# Patient Record
Sex: Male | Born: 1970 | Race: White | Hispanic: No | Marital: Married | Smoking: Former smoker
Health system: Southern US, Community
[De-identification: ages and names within clinical notes are randomized; demographics above are authoritative.]

## PROBLEM LIST (undated history)

## (undated) DIAGNOSIS — E785 Hyperlipidemia, unspecified: Secondary | ICD-10-CM

## (undated) DIAGNOSIS — I10 Essential (primary) hypertension: Secondary | ICD-10-CM

## (undated) DIAGNOSIS — K219 Gastro-esophageal reflux disease without esophagitis: Secondary | ICD-10-CM

## (undated) DIAGNOSIS — N529 Male erectile dysfunction, unspecified: Secondary | ICD-10-CM

## (undated) HISTORY — DX: Gastro-esophageal reflux disease without esophagitis: K21.9

## (undated) HISTORY — DX: Male erectile dysfunction, unspecified: N52.9

## (undated) HISTORY — DX: Essential (primary) hypertension: I10

## (undated) HISTORY — DX: Hyperlipidemia, unspecified: E78.5

## (undated) HISTORY — PX: UPPER GASTROINTESTINAL ENDOSCOPY: SHX188

## (undated) HISTORY — PX: VASECTOMY: SHX75

---

## 2009-05-15 ENCOUNTER — Ambulatory Visit: Payer: Self-pay | Admitting: Family Medicine

## 2009-05-15 ENCOUNTER — Encounter: Admission: RE | Admit: 2009-05-15 | Discharge: 2009-05-15 | Payer: Self-pay | Admitting: Family Medicine

## 2009-05-15 DIAGNOSIS — E7800 Pure hypercholesterolemia, unspecified: Secondary | ICD-10-CM | POA: Insufficient documentation

## 2009-06-19 ENCOUNTER — Ambulatory Visit: Payer: Self-pay | Admitting: Family Medicine

## 2009-06-19 ENCOUNTER — Encounter: Admission: RE | Admit: 2009-06-19 | Discharge: 2009-06-19 | Payer: Self-pay | Admitting: Family Medicine

## 2009-10-26 ENCOUNTER — Ambulatory Visit: Payer: Self-pay | Admitting: Family Medicine

## 2009-10-27 ENCOUNTER — Telehealth: Payer: Self-pay | Admitting: Family Medicine

## 2009-10-28 ENCOUNTER — Encounter: Admission: RE | Admit: 2009-10-28 | Discharge: 2009-10-28 | Payer: Self-pay | Admitting: Orthopedic Surgery

## 2009-11-09 ENCOUNTER — Telehealth: Payer: Self-pay | Admitting: Family Medicine

## 2010-02-19 ENCOUNTER — Ambulatory Visit: Payer: Self-pay | Admitting: Family Medicine

## 2010-08-17 NOTE — Assessment & Plan Note (Signed)
Summary: R knee pain   Vital Signs:  Patient profile:   40 year old male Height:      72 inches Weight:      218 pounds BMI:     29.67 O2 Sat:      99 % on Room air Temp:     98.7 degrees F oral Pulse rate:   76 / minute BP sitting:   144 / 94  (left arm) Cuff size:   large  Vitals Entered By: Payton Spark CMA (October 26, 2009 11:22 AM)  O2 Flow:  Room air CC: R knee pain x 2 days and gettign worse. No known injury.  Pain Assessment Patient in pain? yes     Location: R knee Intensity: 9   Primary Care Provider:  Seymour Bars DO  CC:  R knee pain x 2 days and gettign worse. No known injury. Adrian Mccarty  History of Present Illness: 40 yo WM presents for 2 days of R knee pain that feels like a tightening around the whole knee.  He has been using the elliptical at the gym for the past 2 mos.  He slipped in the garage this weekend and the R knee progressively started hurting worse yesterday.  He has some swelling and mild bruising.   Has mostly  medial joint line pain.  No previous knee problems or surgery. Pain is improved at rest.  Worse with standing and walking.  Woke up from pain last night.  Took Ibuprofen 600 mg which did not help much.  Walking w/ a limp.  Did not use heat or ice.  No 'giving way'.      Current Medications (verified): 1)  Lipitor 20 Mg Tabs (Atorvastatin Calcium) .... Take One Tablet By Mouth Once A Day 2)  Prevacid 30 Mg Cpdr (Lansoprazole) .... Take One Tablet By Mouth Once A Day  Allergies (verified): No Known Drug Allergies  Past History:  Past Medical History: Reviewed history from 05/15/2009 and no changes required. high cholesterol  Social History: Reviewed history from 05/15/2009 and no changes required. 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 Married to [place] and has 2 sons. Never smoked.  5 ETOH/ wk. Works out 4 days./ wk. Good diet.  Review of Systems      See HPI  Physical Exam  General:  alert, well-developed,  well-nourished, and well-hydrated.   Head:  normocephalic and atraumatic.   Msk:  point tender over R medial joint line with + R knee McMurray testing over medial side --+ popping, clicking and pain.  Small effusion with faint bruising.  Neg patellar apprehension.  Normal lachmans and varus/ valgus stress w/o laxity.  passively has full R knee flexion and ext. Antalgic gait with definite limp and pain on weight bearing. Pulses:  2+ popliteal pulse R Extremities:  no ankle/ pedal edema Skin:  color normal.     Impression & Recommendations:  Problem # 1:  KNEE PAIN, RIGHT, ACUTE (ICD-719.46) Likely to be an acute medial medial meniscal tear.  Since he is young and not obese, unlikely to have DJD and given lack of direct trauma, uniliekly fractured so will bypass an Xray.  Start RX NSAID with nighttime pain meds, ice and knee sleeve for comfort.  Schedule MRI to look for meniscal tear.  AVoid exercise until given the OK> The following medications were removed from the medication list:    Vicodin 5-500 Mg Tabs (Hydrocodone-acetaminophen) .Adrian Mccarty... 1-2 tabs by mouth three times a day as  needed severe pain; take with food His updated medication list for this problem includes:    Naprosyn 500 Mg Tabs (Naproxen) .Adrian Mccarty... 1 tab by mouth two times a day with food for knee pain    Hydrocodone-acetaminophen 10-325 Mg Tabs (Hydrocodone-acetaminophen) .Adrian Mccarty... 1-2 tabs by mouth at bedtime as needed knee pain  Orders: T-MRI Lower extremity joint w/o contrast ([address])  Complete Medication List: 1)  Lipitor 20 Mg Tabs (Atorvastatin calcium) .... Take one tablet by mouth once a day 2)  Prevacid 30 Mg Cpdr (Lansoprazole) .... Take one tablet by mouth once a day 3)  Naprosyn 500 Mg Tabs (Naproxen) .Adrian Mccarty.. 1 tab by mouth two times a day with food for knee pain 4)  Hydrocodone-acetaminophen 10-325 Mg Tabs (Hydrocodone-acetaminophen) .Adrian Mccarty.. 1-2 tabs by mouth at bedtime as needed knee pain  Patient Instructions: 1)  Use R knee  sleeve. 2)  Ice R knee on and off (10 min/ time). 3)  Use Naprosyn with BF and dinner. 4)  Use Vicodin at night for pain. 5)  MRI to be scheduled. 6)  Will call you w/ information. 7)  Take it easy. Prescriptions: HYDROCODONE-ACETAMINOPHEN 10-325 MG TABS (HYDROCODONE-ACETAMINOPHEN) 1-2 tabs by mouth at bedtime as needed knee pain  #24 x 0   Entered and Authorized by:   Seymour Bars DO   Signed by:   Seymour Bars DO on 10/26/2009   Method used:   Printed then faxed to ...       [facility]* (retail)       [address] [place], [place]  [address]       Ph: [phone]       Fax: [phone]   RxID:   [id number] NAPROSYN 500 MG TABS (NAPROXEN) 1 tab by mouth two times a day with food for knee pain  #60 x 0   Entered and Authorized by:   Seymour Bars DO   Signed by:   Seymour Bars DO on 10/26/2009   Method used:   Electronically to        [facility]* (retail)       [address] [place], [place]  [address]       Ph: [phone]       Fax: [phone]   RxID:   [id number]
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
## 2010-08-17 NOTE — Progress Notes (Signed)
Summary: Vicodin refill  Phone Note Refill Request Message from:  Patient on November 09, 2009 10:09 AM  Refills Requested: Medication #1:  HYDROCODONE-ACETAMINOPHEN 10-325 MG TABS 1-2 tabs by mouth at bedtime as needed knee pain. Pt states he has a f/u w/ Dr. Lajoyce Corners in 1.5 weeks. After inj from Dr. Lajoyce Corners knee started in improve but after returning to normal activities knee feels the same as it did when it happened. Pt requests refill on vicodin until he sees Dr. Lajoyce Corners. Please advise.  Initial call taken by: Payton Spark CMA,  November 09, 2009 10:11 AM    Prescriptions: HYDROCODONE-ACETAMINOPHEN 10-325 MG TABS (HYDROCODONE-ACETAMINOPHEN) 1-2 tabs by mouth at bedtime as needed knee pain  #20 x 0   Entered and Authorized by:   Seymour Bars DO   Signed by:   Seymour Bars DO on 11/09/2009   Method used:   Printed then faxed to ...       [facility]* (retail)       [address] [place], [place]  [address]       Ph: [phone]       Fax: [phone]   RxID:   [id number]

## 2010-08-17 NOTE — Progress Notes (Signed)
Summary: R knee MRI  Phone Note Outgoing Call   Call placed by: Seymour Bars DO,  October 27, 2009 2:50 PM Summary of Call: I called and LMOM on his cell phone Re: R knee MRI...asked him to call back for details. He has a fraying lateral meniscus with a small tear of the medial mensicus along with fluid (effusion) of the knee, a strained MCL tendon, quadricepts tendinosis and irregularity under the kneecap.  Use knee sleeve, pain meds and come in for a pair of crutches to stay off the knee.  Will get him in with ortho in the next 2 wks.   Initial call taken by: Seymour Bars DO,  October 27, 2009 2:52 PM  Follow-up for Phone Call        He called back.  I called back again and left a more detailed message of MRI findings. Follow-up by: Seymour Bars DO,  October 27, 2009 3:42 PM

## 2010-09-06 ENCOUNTER — Ambulatory Visit (INDEPENDENT_AMBULATORY_CARE_PROVIDER_SITE_OTHER): Payer: 59 | Admitting: Family Medicine

## 2010-09-06 ENCOUNTER — Encounter: Payer: Self-pay | Admitting: Family Medicine

## 2010-09-06 DIAGNOSIS — R030 Elevated blood-pressure reading, without diagnosis of hypertension: Secondary | ICD-10-CM

## 2010-09-06 DIAGNOSIS — H9209 Otalgia, unspecified ear: Secondary | ICD-10-CM

## 2010-09-14 NOTE — Assessment & Plan Note (Signed)
Summary: Otalgia, BP   Vital Signs:  Patient profile:   40 year old male Height:      72 inches Weight:      216 pounds BMI:     29.40 O2 Sat:      99 % on Room air Pulse rate:   94 / minute BP sitting:   143 / 90  (left arm) Cuff size:   large  Vitals Entered By: Payton Spark CMA (September 06, 2010 1:09 PM)  O2 Flow:  Room air CC: R ear pain and HA x 3 days.   Primary Care Provider:  Seymour Bars DO  CC:  R ear pain and HA x 3 days.Marland Kitchen  History of Present Illness: R ear pain and HA x 3 days.. Sore around the cartilage. Radiating HA into the side of his head and jaw. Today right side of throat is painful. Not sure if a fever. No drainage.  Ear feels full.  No URI or cough sxs. No old surgeries. Took some nyquail.    Current Medications (verified): 1)  Lipitor 20 Mg Tabs (Atorvastatin Calcium) .... Take One Tablet By Mouth Once A Day 2)  Prevacid 30 Mg Cpdr (Lansoprazole) .... Take One Tablet By Mouth Once A Day  Allergies (verified): No Known Drug Allergies  Physical Exam  General:  Well-developed,well-nourished,in no acute distress; alert,appropriate and cooperative throughout examination Head:  Normocephalic and atraumatic without obvious abnormalities. No apparent alopecia or balding. Eyes:  No corneal or conjunctival inflammation noted. EOMI. Perrla.  Ears:  External ear exam shows no significant lesions or deformities.  Otoscopic examination reveals clear canals, tympanic membranes are intact bilaterally without bulging, retraction, inflammation or discharge. Hearing is grossly normal bilaterally.Small amt of fluid in behind the right TM.  Nose:  External nasal examination shows no deformity or inflammation.  Mouth:  Oral mucosa and oropharynx without lesions or exudates.  Teeth in good repair. Neck:  No deformities, masses, or tenderness noted. Lungs:  Normal respiratory effort, chest expands symmetrically. Lungs are clear to auscultation, no crackles or  wheezes. Heart:  Normal rate and regular rhythm. S1 and S2 normal without gallop, murmur, click, rub or other extra sounds. Skin:  no rashes.   Cervical Nodes:  No lymphadenopathy noted Psych:  Cognition and judgment appear intact. Alert and cooperative with normal attention span and concentration. No apparent delusions, illusions, hallucinations   Impression & Recommendations:  Problem # 1:  OTALGIA (ICD-388.70) No sign of acute infection. Likey eustachian tube dysfuction.  Dicussed trial of nasal steroid spray.  Avoid decongestants since BP elevated today.  Also can try an OTC antihistamine.    Problem # 2:  ELEVATED BLOOD PRESSURE (ICD-796.2) Discussed need to f/u for recheck in 2 weeks when not in pain and off all cold medications.  He did have a card from giving blood where some of his BPs were high but some were normal He says he already avoids salt in his diet.   Complete Medication List: 1)  Lipitor 20 Mg Tabs (Atorvastatin calcium) .... Take one tablet by mouth once a day 2)  Prevacid 30 Mg Cpdr (Lansoprazole) .... Take one tablet by mouth once a day 3)  Fluticasone Propionate 50 Mcg/act Susp (Fluticasone propionate) .... 2 sprays in each nostril daily  Patient Instructions: 1)  Follow up with Dr. Cathey Endow in 2 weeks for Blood pressure check 2)  Can take allegra, claritin, or zyrtec OTC and use the nasal spray (2 sprays in each nostril)  to  see if helps your ear symptoms.  3)  If you suddenly get worse then call our office.  Prescriptions: FLUTICASONE PROPIONATE 50 MCG/ACT SUSP (FLUTICASONE PROPIONATE) 2 sprays in each nostril daily  #1 x 0   Entered and Authorized by:   Nani Gasser MD   Signed by:   Nani Gasser MD on 09/06/2010   Method used:   Electronically to        [facility] [phone]* (retail)       [address]       [place], [place]  [address]       Ph: [phone] or [phone]       Fax: [phone]   RxID:   [id number]    Orders  Added: 1)  Est. Patient Level III [[address]]

## 2011-06-28 ENCOUNTER — Ambulatory Visit (INDEPENDENT_AMBULATORY_CARE_PROVIDER_SITE_OTHER): Payer: PRIVATE HEALTH INSURANCE | Admitting: Family Medicine

## 2011-06-28 ENCOUNTER — Encounter: Payer: Self-pay | Admitting: Family Medicine

## 2011-06-28 DIAGNOSIS — I10 Essential (primary) hypertension: Secondary | ICD-10-CM

## 2011-06-28 DIAGNOSIS — E785 Hyperlipidemia, unspecified: Secondary | ICD-10-CM

## 2011-06-28 DIAGNOSIS — K219 Gastro-esophageal reflux disease without esophagitis: Secondary | ICD-10-CM

## 2011-06-28 DIAGNOSIS — Z23 Encounter for immunization: Secondary | ICD-10-CM

## 2011-06-28 DIAGNOSIS — N529 Male erectile dysfunction, unspecified: Secondary | ICD-10-CM

## 2011-06-28 MED ORDER — TADALAFIL 5 MG PO TABS: 5.0000 mg | ORAL_TABLET | Freq: Every day | ORAL | Status: DC | PRN

## 2011-06-28 MED ORDER — ATORVASTATIN CALCIUM 20 MG PO TABS: 20.0000 mg | ORAL_TABLET | Freq: Every day | ORAL | Status: DC

## 2011-06-28 MED ORDER — IRBESARTAN 150 MG PO TABS: 150.0000 mg | ORAL_TABLET | Freq: Every day | ORAL | Status: DC

## 2011-06-28 MED ORDER — TETANUS-DIPHTH-ACELL PERTUSSIS 5-2.5-18.5 LF-MCG/0.5 IM SUSP: 0.5000 mL | Freq: Once | INTRAMUSCULAR | Status: DC

## 2011-06-28 NOTE — Patient Instructions (Signed)
Erectile Dysfunction Erectile dysfunction (ED) is the inability to get a good enough erection to have sexual intercourse. ED may involve:  Inability to get an erection.   Lack of enough hardness to allow penetration.   Loss of the erection before sex is finished.   Premature ejaculation.   Any combination of these problems if they occur more than 25% of the time.  CAUSES  Certain drugs, such as:   Pain relievers.   Antihistamines.   Antidepressants.   Blood pressure medicines.   Water pills.   Ulcer medicines.   Muscle relaxants.   Illegal drugs.   Excessive drinking.   Psychological causes, such as:   Anxiety.   Depression.   Sadness.   Exhaustion.   Performance fear.   Stress.   Physical causes, such as:   Artery problems. This may include diabetes, smoking, liver disease, or atherosclerosis.   High blood pressure.   Hormonal problems, such as low testosterone.   Obesity.   Nerve problems. This may include back or pelvic injuries, diabetes, multiple sclerosis, Parkinson's disease, or some surgeries.  SYMPTOMS  Inability to get an erection.   Lack of enough hardness to allow penetration.   Loss of the erection before sex is finished.   Premature ejaculation.   Normal erections at some times, but with frequent unsatisfactory episodes.   Orgasms that are not satisfactory in sensation or frequency.   Low sexual satisfaction in either partner because of erection problems.   A curved penis occurring with erection. The curve may cause pain or may be too curved to allow for intercourse.   Never having nighttime erections.  DIAGNOSIS Your caregiver can often diagnose this condition by:  Performing a physical exam to find other diseases or specific problems with the penis.   Asking you detailed questions about the problem.   Performing blood tests to check for diabetes or to measure hormone levels.   Performing urine tests to find other  underlying health conditions.   Performing an ultrasound to check for scarring.   Performing a test to check blood flow to the penis.   Doing a sleep study at home to measure nighttime erections.  TREATMENT   You may be prescribed medicines by mouth.   You may be given medicine injections into the penis.   You may be prescribed a vacuum pump with a ring.   Penile implant surgery may be performed. You may receive:   An inflatable implant.   A semi-rigid implant.   Blood vessel surgery may be performed.  HOME CARE INSTRUCTIONS  Take all medicine as directed by your caregiver. Do not take any other medicines without talking to your caregiver first.   Follow your caregiver's directions for specific treatments as prescribed.   Follow up with your caregiver as directed.  Document Released: 07/01/2000 Document Revised: 03/16/2011 Document Reviewed: 10/24/2010 [facility] Patient Information 2012 [place], [place].Hypertriglyceridemia  Diet for High blood levels of Triglycerides Most fats in food are triglycerides. Triglycerides in your blood are stored as fat in your body. High levels of triglycerides in your blood may put you at a greater risk for heart disease and stroke.  Normal triglyceride levels are less than 150 mg/dL. Borderline high levels are 150-199 mg/dl. High levels are 200 - 499 mg/dL, and very high triglyceride levels are greater than 500 mg/dL. The decision to treat high triglycerides is generally based on the level. For people with borderline or high triglyceride levels, treatment includes weight loss and exercise.  Drugs are recommended for people with very high triglyceride levels. Many people who need treatment for high triglyceride levels have metabolic syndrome. This syndrome is a collection of disorders that often include: insulin resistance, high blood pressure, blood clotting problems, high cholesterol and triglycerides. TESTING PROCEDURE FOR TRIGLYCERIDES  You should  not eat 4 hours before getting your triglycerides measured. The normal range of triglycerides is between 10 and 250 milligrams per deciliter (mg/dl). Some people may have extreme levels (1000 or above), but your triglyceride level may be too high if it is above 150 mg/dl, depending on what other risk factors you have for heart disease.   People with high blood triglycerides may also have high blood cholesterol levels. If you have high blood cholesterol as well as high blood triglycerides, your risk for heart disease is probably greater than if you only had high triglycerides. High blood cholesterol is one of the main risk factors for heart disease.  CHANGING YOUR DIET  Your weight can affect your blood triglyceride level. If you are more than 20% above your ideal body weight, you may be able to lower your blood triglycerides by losing weight. Eating less and exercising regularly is the best way to combat this. Fat provides more calories than any other food. The best way to lose weight is to eat less fat. Only 30% of your total calories should come from fat. Less than 7% of your diet should come from saturated fat. A diet low in fat and saturated fat is the same as a diet to decrease blood cholesterol. By eating a diet lower in fat, you may lose weight, lower your blood cholesterol, and lower your blood triglyceride level.  Eating a diet low in fat, especially saturated fat, may also help you lower your blood triglyceride level. Ask your dietitian to help you figure how much fat you can eat based on the number of calories your caregiver has prescribed for you.  Exercise, in addition to helping with weight loss may also help lower triglyceride levels.   Alcohol can increase blood triglycerides. You may need to stop drinking alcoholic beverages.   Too much carbohydrate in your diet may also increase your blood triglycerides. Some complex carbohydrates are necessary in your diet. These may include bread, rice,  potatoes, other starchy vegetables and cereals.   Reduce "simple" carbohydrates. These may include pure sugars, candy, honey, and jelly without losing other nutrients. If you have the kind of high blood triglycerides that is affected by the amount of carbohydrates in your diet, you will need to eat less sugar and less high-sugar foods. Your caregiver can help you with this.   Adding 2-4 grams of fish oil (EPA+ DHA) may also help lower triglycerides. Speak with your caregiver before adding any supplements to your regimen.  Following the Diet  Maintain your ideal weight. Your caregivers can help you with a diet. Generally, eating less food and getting more exercise will help you lose weight. Joining a weight control group may also help. Ask your caregivers for a good weight control group in your area.  Eat low-fat foods instead of high-fat foods. This can help you lose weight too.  These foods are lower in fat. Eat MORE of these:   Dried beans, peas, and lentils.   Egg whites.   Low-fat cottage cheese.   Fish.   Lean cuts of meat, such as round, sirloin, rump, and flank (cut extra fat off meat you fix).   Whole grain breads,  cereals and pasta.   Skim and nonfat dry milk.   Low-fat yogurt.   Poultry without the skin.   Cheese made with skim or part-skim milk, such as mozzarella, parmesan, farmers', ricotta, or pot cheese.  These are higher fat foods. Eat LESS of these:   Whole milk and foods made from whole milk, such as American, blue, cheddar, monterey jack, and swiss cheese   High-fat meats, such as luncheon meats, sausages, knockwurst, bratwurst, hot dogs, ribs, corned beef, ground pork, and regular ground beef.   Fried foods.  Limit saturated fats in your diet. Substituting unsaturated fat for saturated fat may decrease your blood triglyceride level. You will need to read package labels to know which products contain saturated fats.  These foods are high in saturated fat. Eat  LESS of these:   Fried pork skins.   Whole milk.   Skin and fat from poultry.   Palm oil.   Butter.   Shortening.   Cream cheese.   Tomasa Blase.   Margarines and baked goods made from listed oils.   Vegetable shortenings.   Chitterlings.   Fat from meats.   Coconut oil.   Palm kernel oil.   Lard.   Cream.   Sour cream.   Fatback.   Coffee whiteners and non-dairy creamers made with these oils.   Cheese made from whole milk.  Use unsaturated fats (both polyunsaturated and monounsaturated) moderately. Remember, even though unsaturated fats are better than saturated fats; you still want a diet low in total fat.  These foods are high in unsaturated fat:   Canola oil.   Sunflower oil.   Mayonnaise.   Almonds.   Peanuts.   Pine nuts.   Margarines made with these oils.   Safflower oil.   Olive oil.   Avocados.   Cashews.   Peanut butter.   Sunflower seeds.   Soybean oil.   Peanut oil.   Olives.   Pecans.   Walnuts.   Pumpkin seeds.  Avoid sugar and other high-sugar foods. This will decrease carbohydrates without decreasing other nutrients. Sugar in your food goes rapidly to your blood. When there is excess sugar in your blood, your liver may use it to make more triglycerides. Sugar also contains calories without other important nutrients.  Eat LESS of these:   Sugar, brown sugar, powdered sugar, jam, jelly, preserves, honey, syrup, molasses, pies, candy, cakes, cookies, frosting, pastries, colas, soft drinks, punches, fruit drinks, and regular gelatin.   Avoid alcohol. Alcohol, even more than sugar, may increase blood triglycerides. In addition, alcohol is high in calories and low in nutrients. Ask for sparkling water, or a diet soft drink instead of an alcoholic beverage.  Suggestions for planning and preparing meals   Bake, broil, grill or roast meats instead of frying.   Remove fat from meats and skin from poultry before cooking.   Add  spices, herbs, lemon juice or vinegar to vegetables instead of salt, rich sauces or gravies.   Use a non-stick skillet without fat or use no-stick sprays.   Cool and refrigerate stews and broth. Then remove the hardened fat floating on the surface before serving.   Refrigerate meat drippings and skim off fat to make low-fat gravies.   Serve more fish.   Use less butter, margarine and other high-fat spreads on bread or vegetables.   Use skim or reconstituted non-fat dry milk for cooking.   Cook with low-fat cheeses.   Substitute low-fat yogurt or cottage cheese for  all or part of the sour cream in recipes for sauces, dips or congealed salads.   Use half yogurt/half mayonnaise in salad recipes.   Substitute evaporated skim milk for cream. Evaporated skim milk or reconstituted non-fat dry milk can be whipped and substituted for whipped cream in certain recipes.   Choose fresh fruits for dessert instead of high-fat foods such as pies or cakes. Fruits are naturally low in fat.  When Dining Out   Order low-fat appetizers such as fruit or vegetable juice, pasta with vegetables or tomato sauce.   Select clear, rather than cream soups.   Ask that dressings and gravies be served on the side. Then use less of them.   Order foods that are baked, broiled, poached, steamed, stir-fried, or roasted.   Ask for margarine instead of butter, and use only a small amount.   Drink sparkling water, unsweetened tea or coffee, or diet soft drinks instead of alcohol or other sweet beverages.  QUESTIONS AND ANSWERS ABOUT OTHER FATS IN THE BLOOD: SATURATED FAT, TRANS FAT, AND CHOLESTEROL What is trans fat? Trans fat is a type of fat that is formed when vegetable oil is hardened through a process called hydrogenation. This process helps makes foods more solid, gives them shape, and prolongs their shelf life. Trans fats are also called hydrogenated or partially hydrogenated oils.  What do saturated fat,  trans fat, and cholesterol in foods have to do with heart disease? Saturated fat, trans fat, and cholesterol in the diet all raise the level of LDL "bad" cholesterol in the blood. The higher the LDL cholesterol, the greater the risk for coronary heart disease (CHD). Saturated fat and trans fat raise LDL similarly.  What foods contain saturated fat, trans fat, and cholesterol? High amounts of saturated fat are found in animal products, such as fatty cuts of meat, chicken skin, and full-fat dairy products like butter, whole milk, cream, and cheese, and in tropical vegetable oils such as palm, palm kernel, and coconut oil. Trans fat is found in some of the same foods as saturated fat, such as vegetable shortening, some margarines (especially hard or stick margarine), crackers, cookies, baked goods, fried foods, salad dressings, and other processed foods made with partially hydrogenated vegetable oils. Small amounts of trans fat also occur naturally in some animal products, such as milk products, beef, and lamb. Foods high in cholesterol include liver, other organ meats, egg yolks, shrimp, and full-fat dairy products. How can I use the new food label to make heart-healthy food choices? Check the Nutrition Facts panel of the food label. Choose foods lower in saturated fat, trans fat, and cholesterol. For saturated fat and cholesterol, you can also use the Percent Daily Value (%DV): 5% DV or less is low, and 20% DV or more is high. (There is no %DV for trans fat.) Use the Nutrition Facts panel to choose foods low in saturated fat and cholesterol, and if the trans fat is not listed, read the ingredients and limit products that list shortening or hydrogenated or partially hydrogenated vegetable oil, which tend to be high in trans fat. POINTS TO REMEMBER: YOU NEED A LITTLE TLC (THERAPEUTIC LIFESTYLE CHANGES)  Discuss your risk for heart disease with your caregivers, and take steps to reduce risk factors.   Change  your diet. Choose foods that are low in saturated fat, trans fat, and cholesterol.   Add exercise to your daily routine if it is not already being done. Participate in physical activity of moderate intensity,  like brisk walking, for at least 30 minutes on most, and preferably all days of the week. No time? Break the 30 minutes into three, 10-minute segments during the day.   Stop smoking. If you do smoke, contact your caregiver to discuss ways in which they can help you quit.   Do not use street drugs.   Maintain a normal weight.   Maintain a healthy blood pressure.   Keep up with your blood work for checking the fats in your blood as directed by your caregiver.  Document Released: 04/21/2004 Document Revised: 03/16/2011 Document Reviewed: 11/17/2008 [facility] Patient Information 2012 [place], [place].

## 2011-06-28 NOTE — Progress Notes (Signed)
  Subjective:    Patient ID: [place] Cinqu[place], male    DOB: 07-10-1971, 40 y.o.   MRN: [id number]  Hyperlipidemia This is a chronic problem. The current episode started more than 1 year ago. The problem is uncontrolled. Recent lipid tests were reviewed and are high. Factors aggravating his hyperlipidemia include no known factors. Associated symptoms include myalgias. Pertinent negatives include no chest pain, focal sensory loss, focal weakness, leg pain or shortness of breath. Current antihyperlipidemic treatment includes statins. The current treatment provides moderate improvement of lipids. Compliance problems include adherence to exercise.  Risk factors for coronary artery disease include hypertension, male sex and family history.  Hypertension This is a chronic problem. The current episode started more than 1 year ago. The problem is uncontrolled. Pertinent negatives include no chest pain or shortness of breath. There are no associated agents to hypertension. Risk factors for coronary artery disease include male gender, dyslipidemia and family history. Compliance problems include medication cost.  There is no history of angina, kidney disease, CAD/MI, CVA or left ventricular hypertrophy.   he states that his blood pressure always stays a low but higher than what it should be and that he is on Cozaar right now but doesn't seem to get his blood pressure under good control He also reports having blood work drawn at work and it also showed that his HDL was in the 60s LDL was about [birth year]1928 and his total cholesterol was higher than it was before when he was on Lipitor. He is currently on Zocor. #3 erectile dysfunction reports as being on Cialis erectile dysfunction has improved greatly he also reports that the recurrence of prostatitis and epididymitis that he was having seems to also cleared up. #4 Immunizations up-to-date needed  Review of Systems  Respiratory: Negative for shortness of breath.     Cardiovascular: Negative for chest pain.  Genitourinary:       Erectile dysfunction  Musculoskeletal: Positive for myalgias.  Neurological: Negative for focal weakness.       Objective:   Physical Exam BP 143/96  Pulse 77  Ht 6' (1.829 m)  Wt 221 lb (100.245 kg)  BMI 29.97 kg/m2  SpO2 100%  Well-nourished well-developed white male no acute distress lungs were clear to culture to auscultation percussion cardiac S1-S2 regular rate and rhythm Neck supple thyroid gland within normal limits     Assessment & Plan:  #1 hypertension discussed them my lack of confidence and Cozaar we'll place him on Avapro 150 one tablet a day and titrate him up as needed to 300. We'll even go as far as needed place him on Avalide. 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 Addendum he's informed me that he takes Prevacid 15 mg over the OTC he doesn't get a prescription for that.

## 2011-07-08 ENCOUNTER — Other Ambulatory Visit: Payer: Self-pay | Admitting: *Deleted

## 2011-07-08 DIAGNOSIS — N529 Male erectile dysfunction, unspecified: Secondary | ICD-10-CM

## 2011-07-08 MED ORDER — TADALAFIL 5 MG PO TABS: 5.0000 mg | ORAL_TABLET | Freq: Every day | ORAL | Status: DC | PRN

## 2011-09-06 ENCOUNTER — Other Ambulatory Visit: Payer: Self-pay | Admitting: *Deleted

## 2011-09-06 DIAGNOSIS — E785 Hyperlipidemia, unspecified: Secondary | ICD-10-CM

## 2011-09-06 DIAGNOSIS — I10 Essential (primary) hypertension: Secondary | ICD-10-CM

## 2011-09-06 MED ORDER — ATORVASTATIN CALCIUM 20 MG PO TABS: 20.0000 mg | ORAL_TABLET | Freq: Every day | ORAL | Status: DC

## 2011-09-06 MED ORDER — IRBESARTAN 150 MG PO TABS: 150.0000 mg | ORAL_TABLET | Freq: Every day | ORAL | Status: DC

## 2011-09-15 ENCOUNTER — Ambulatory Visit: Payer: PRIVATE HEALTH INSURANCE | Admitting: Family Medicine

## 2011-09-16 ENCOUNTER — Ambulatory Visit (INDEPENDENT_AMBULATORY_CARE_PROVIDER_SITE_OTHER): Payer: PRIVATE HEALTH INSURANCE | Admitting: Physician Assistant

## 2011-09-16 ENCOUNTER — Encounter: Payer: Self-pay | Admitting: Physician Assistant

## 2011-09-16 VITALS — BP 132/84 | HR 101 | Temp 98.7°F | Ht 72.0 in | Wt 224.0 lb

## 2011-09-16 DIAGNOSIS — R05 Cough: Secondary | ICD-10-CM

## 2011-09-16 DIAGNOSIS — R059 Cough, unspecified: Secondary | ICD-10-CM

## 2011-09-16 DIAGNOSIS — J329 Chronic sinusitis, unspecified: Secondary | ICD-10-CM

## 2011-09-16 MED ORDER — AMOXICILLIN-POT CLAVULANATE 875-125 MG PO TABS: 1.0000 | ORAL_TABLET | Freq: Two times a day (BID) | ORAL | Status: AC

## 2011-09-16 MED ORDER — HYDROCOD POLST-CHLORPHEN POLST 10-8 MG/5ML PO LQCR: 5.0000 mL | Freq: Every evening | ORAL | Status: DC | PRN

## 2011-09-16 NOTE — Patient Instructions (Signed)
Start Augmentin for 10 days twice a day. Tussinonex was given for cough at night. Follow up if not improving in 3 days.

## 2011-09-16 NOTE — Progress Notes (Signed)
  Subjective:    Patient ID: Adrian Mccarty, male    DOB: [birth year], 41 y.o.   MRN: [id number]  HPI Patient has had upper respiratory symptoms for 5 days. He has had sinus pressure, teeth hurting, cough that is worse at night, runny nose, sore throat, and headache. He denies any fever, N/V, or ear pain. Mucinex D has helped somewhat but still continues to have symptoms.     Review of Systems     Objective:   Physical Exam  Constitutional: He appears well-developed and well-nourished.  HENT:  Head: Normocephalic and atraumatic.  Right Ear: External ear normal.  Left Ear: External ear normal.  Nose: Nose normal.  Mouth/Throat: Oropharynx is clear and moist. No oropharyngeal exudate.       TM's are normal bilaterally. Maxillary tenderness noted bilaterally with palpation.   Eyes: Conjunctivae are normal.  Neck:       Bilateral anterior cervical adenopathy with tenderness to palpation.  Lymphadenopathy:    He has cervical adenopathy.          Assessment & Plan:  Sinusitis- Augmentin twice a day for 10 days. Continue with other symptomatic treatment. Follow up if not improving in 3 days.  Cough- Tussionex for cough to use at bedtime only. Continue to cough during the day to get up any mucus that might settle.

## 2011-10-30 ENCOUNTER — Other Ambulatory Visit: Payer: Self-pay | Admitting: Family Medicine

## 2011-11-02 ENCOUNTER — Other Ambulatory Visit: Payer: Self-pay | Admitting: *Deleted

## 2011-11-02 DIAGNOSIS — N529 Male erectile dysfunction, unspecified: Secondary | ICD-10-CM

## 2011-11-02 MED ORDER — TADALAFIL 5 MG PO TABS: 5.0000 mg | ORAL_TABLET | Freq: Every day | ORAL | Status: DC | PRN

## 2011-11-15 ENCOUNTER — Ambulatory Visit (INDEPENDENT_AMBULATORY_CARE_PROVIDER_SITE_OTHER): Payer: PRIVATE HEALTH INSURANCE | Admitting: Family Medicine

## 2011-11-15 ENCOUNTER — Encounter: Payer: Self-pay | Admitting: Family Medicine

## 2011-11-15 VITALS — BP 138/90 | HR 89 | Ht 72.0 in | Wt 223.0 lb

## 2011-11-15 DIAGNOSIS — Z1211 Encounter for screening for malignant neoplasm of colon: Secondary | ICD-10-CM

## 2011-11-15 DIAGNOSIS — B356 Tinea cruris: Secondary | ICD-10-CM

## 2011-11-15 DIAGNOSIS — Z0000 Encounter for general adult medical examination without abnormal findings: Secondary | ICD-10-CM

## 2011-11-15 DIAGNOSIS — I10 Essential (primary) hypertension: Secondary | ICD-10-CM | POA: Insufficient documentation

## 2011-11-15 DIAGNOSIS — M7552 Bursitis of left shoulder: Secondary | ICD-10-CM

## 2011-11-15 DIAGNOSIS — N529 Male erectile dysfunction, unspecified: Secondary | ICD-10-CM | POA: Insufficient documentation

## 2011-11-15 DIAGNOSIS — E785 Hyperlipidemia, unspecified: Secondary | ICD-10-CM

## 2011-11-15 LAB — POCT URINALYSIS DIPSTICK
Blood, UA: NEGATIVE
Ketones, UA: NEGATIVE
Nitrite, UA: NEGATIVE

## 2011-11-15 LAB — CBC WITH DIFFERENTIAL/PLATELET
Basophils Absolute: 0 10*3/uL (ref 0.0–0.1)
Eosinophils Relative: 1 % (ref 0–5)
HCT: 42 % (ref 39.0–52.0)
Lymphs Abs: 1.5 10*3/uL (ref 0.7–4.0)
Monocytes Absolute: 0.5 10*3/uL (ref 0.1–1.0)
Monocytes Relative: 9 % (ref 3–12)
Neutrophils Relative %: 64 % (ref 43–77)
RDW: 13.4 % (ref 11.5–15.5)
WBC: 5.9 10*3/uL (ref 4.0–10.5)

## 2011-11-15 LAB — COMPREHENSIVE METABOLIC PANEL
ALT: 17 U/L (ref 0–53)
Albumin: 5.1 g/dL (ref 3.5–5.2)
Chloride: 100 mEq/L (ref 96–112)
Creat: 0.91 mg/dL (ref 0.50–1.35)
Glucose, Bld: 105 mg/dL — ABNORMAL HIGH (ref 70–99)

## 2011-11-15 LAB — PSA, TOTAL AND FREE
PSA, Free Pct: 21 % — ABNORMAL LOW (ref >25)
PSA: 1.12 ng/mL (ref <=4.00)

## 2011-11-15 LAB — LIPID PANEL
LDL Cholesterol: 112 mg/dL — ABNORMAL HIGH (ref 0–99)
Triglycerides: 140 mg/dL (ref <150)

## 2011-11-15 LAB — TSH: TSH: 2.649 u[IU]/mL (ref 0.350–4.500)

## 2011-11-15 MED ORDER — MELOXICAM 15 MG PO TABS: 15.0000 mg | ORAL_TABLET | Freq: Every day | ORAL | Status: DC | PRN

## 2011-11-15 MED ORDER — KETOCONAZOLE 2 % EX CREA: TOPICAL_CREAM | Freq: Two times a day (BID) | CUTANEOUS | Status: DC

## 2011-11-15 MED ORDER — TADALAFIL 20 MG PO TABS
20.0000 mg | ORAL_TABLET | Freq: Every day | ORAL | Status: DC | PRN
Start: 2011-11-15 — End: 2012-05-03

## 2011-11-15 NOTE — Patient Instructions (Signed)
Radial Nerve Palsy Wrist drop is also known as radial nerve palsy. It is a condition in which you can not extend your wrist. This means if you are standing with your elbow bent at a right angle and with the top of your hand pointed at the ceiling, you can not hold your hand up. It falls toward the floor.  This action of extending your wrist is caused by the muscles in the back of your arm. These muscles are controlled by the radial nerve. This means that anything affecting the radial nerve so it can not tell the muscles how to work will cause wrist drop. This is medically called radial nerve palsy. Also the radial nerve is a motor and sensory nerve so anything affecting it causes problems with movement and feeling. CAUSES  Some more common causes of wrist drop are:  A break (fracture) of the large bone in the arm between your shoulder and your elbow (humerus). This is because the radial nerve winds around the humerus.   Improper use of crutches causes this because the radial nerve runs through the armpit (axilla). Crutches which are too long can put pressure on the nerve. This is sometimes called crutch palsy.   Falling asleep with your arm over a chair and supported on the back is a common cause. This is sometimes called Saturday Night Syndrome.   Wrist drop can be associated with lead poisoning because of the effect of lead on the radial nerve.  SYMPTOMS  The wrist drop is an obvious problem, but there may also be numbness in the back of the arm, forearm or hand which provides feeling in these areas by the radial nerve. There can be difficulty straightening out the elbow in addition to the wrist. There may be numbness, tingling, pain, burning sensations or other abnormal feelings. Symptoms depend entirely on where the radial nerve is injured. DIAGNOSIS   Wrist drop is obvious just by looking at it. Your caregiver may make the diagnosis by taking your history and doing a couple tests.   One test  which may be done is a nerve conduction study. This test shows if the radial nerve is conducting signals well. If not, it can determine where the nerve problem is.   Sometimes X-ray studies are done. Your caregiver will determine if further testing needs to be done.  TREATMENT   Usually if the problem is found to be pressure on the nerve, simply removing the pressure will allow the nerve to go back to normal in a few weeks to a few months. Other treatments will depend upon the cause found.   Only take over-the-counter or prescription medicines for pain, discomfort, or fever as directed by your caregiver.   Sometimes seizure medications are used.   Steroids are sometimes given to decrease swelling if it is thought to be a possible cause.  Document Released: 03/10/2006 Document Revised: 06/23/2011 Document Reviewed: 04/20/2006 [facility] Patient Information 2012 [place], [place].Bursitis Bursitis is a swelling and soreness (inflammation) of a fluid-filled sac (bursa) that overlies and protects a joint. It can be caused by injury, overuse of the joint, arthritis or infection. The joints most likely to be affected are the elbows, shoulders, hips and knees. HOME CARE INSTRUCTIONS   Apply ice to the affected area for 15 to 20 minutes each hour while awake for 2 days. Put the ice in a plastic bag and place a towel between the bag of ice and your skin.   Rest the injured  joint as much as possible, but continue to put the joint through a full range of motion, 4 times per day. (The shoulder joint especially becomes rapidly "frozen" if not used.) When the pain lessens, begin normal slow movements and usual activities.   Only take over-the-counter or prescription medicines for pain, discomfort or fever as directed by your caregiver.   Your caregiver may recommend draining the bursa and injecting medicine into the bursa. This may help the healing process.   Follow all instructions for follow-up with your  caregiver. This includes any orthopedic referrals, physical therapy and rehabilitation. Any delay in obtaining necessary care could result in a delay or failure of the bursitis to heal and chronic pain.  SEEK IMMEDIATE MEDICAL CARE IF:   Your pain increases even during treatment.   You develop an oral temperature above 102 F (38.9 C) and have heat and inflammation over the involved bursa.  MAKE SURE YOU:   Understand these instructions.   Will watch your condition.   Will get help right away if you are not doing well or get worse.  Document Released: 07/01/2000 Document Revised: 06/23/2011 Document Reviewed: 06/05/2009 [facility] Patient Information 2012 [place], [place].

## 2011-11-15 NOTE — Progress Notes (Signed)
Subjective:    Patient ID: Adrian Mccarty, male    DOB: [birth year], 41 y.o.   MRN: [id number]  HPI Patient's here for physical examination. Reports he is happy with his BP results. He states he checks his blood pressure regular basis and it looks great. He will surprise it was as high as it was today.   Review of Systems  Constitutional: Positive for activity change.       Reports starting to exercise more  Musculoskeletal: Positive for myalgias and arthralgias.       He reports pain in the left shoulder.  He also reports pain in both hands when he does repetitive motions or movements. He states worse in the right than the left and basically involves the thumb pointed finger and index finger. He states the hand feels like it is falling asleep and he has to shake it to wake it up.  Skin: Positive for rash.       Reports rash between his legs but seemed to get worse after exercising or doing activities that makes him sweat a lot   No Known Allergies History   Social History  . Marital Status: Married    Spouse Name: N/A    Number of Children: N/A  . Years of Education: N/A   Occupational History  . Not on file.   Social History Main Topics  . Smoking status: Former Games developer  . Smokeless tobacco: Current User    Types: Chew  . Alcohol Use: 3.6 oz/week    6 Cans of beer per week  . Drug Use: Not on file  . Sexually Active: Yes    Birth Control/ Protection: Surgical   Other Topics Concern  . Not on file   Social History Narrative  . No narrative on file   Family History  Problem Relation Age of Onset  . Heart disease Mother   . Hypertension Father   . Heart disease Maternal Grandfather   . Heart disease Paternal Grandfather    Past Medical History  Diagnosis Date  . Hypertension   . Hyperlipidemia   . Erectile dysfunction   . GERD (gastroesophageal reflux disease)    No past surgical history on file.     BP 138/90  Pulse 89  Ht 6' (1.829 m)  Wt 223 lb (101.152  kg)  BMI 30.24 kg/m2  SpO2 100% Objective:   Physical Exam  Constitutional: He is oriented to person, place, and time. He appears well-developed and well-nourished. No distress.  HENT:  Head: Normocephalic.  Eyes: Conjunctivae are normal. Pupils are equal, round, and reactive to light.  Fundoscopic exam:      The right eye shows no arteriolar narrowing and no AV nicking.       The left eye shows no arteriolar narrowing and no AV nicking.  Neck: Normal range of motion. Neck supple. No tracheal deviation present. No thyromegaly present.  Cardiovascular: Normal rate, regular rhythm and normal heart sounds.   No murmur heard. Pulmonary/Chest: Effort normal and breath sounds normal. No respiratory distress. He has no wheezes.  Abdominal: Soft. Bowel sounds are normal. He exhibits no distension. There is no tenderness. Hernia confirmed negative in the right inguinal area and confirmed negative in the left inguinal area.  Genitourinary: Rectum normal, prostate normal, testes normal and penis normal. Rectal exam shows no external hemorrhoid, no fissure and anal tone normal. Guaiac negative stool. Prostate is not enlarged and not tender. Right testis shows no mass and no tenderness. Left  testis shows no mass and no tenderness. Circumcised. No penile tenderness. No discharge found.  Musculoskeletal: Normal range of motion. He exhibits tenderness.       Tenderness over the left deltoid pinpoint location consistent with bursitis of the left shoulder  Lymphadenopathy:    He has no cervical adenopathy.  Neurological: He is alert and oriented to person, place, and time. He has normal reflexes. No cranial nerve deficit.       Tenderness over the right radial area of the wrist which reproduces the pain he experiences and thumb and pointer and index finger with palpation.  Skin: Skin is warm and dry. Rash noted. No erythema.       Rash in the groin area consistent with tinea infection.  Psychiatric: He has a  normal mood and affect.   EKG showed a sinus rhythm.       Results for orders placed in visit on 11/15/11  POCT URINALYSIS DIPSTICK      Component Value Range   Color, UA yellow     Clarity, UA clear     Glucose, UA neg     Bilirubin, UA neg     Ketones, UA neg     Spec Grav, UA 1.010     Blood, UA neg     pH, UA 7.0     Protein, UA neg     Urobilinogen, UA 0.2     Nitrite, UA neg     Leukocytes, UA Negative     Assessment & Plan:  #1 health maintenance. Routine labs will be obtained including CBC CMP, TSH, lipid, testosterone and PSA., #2 hypertension. I considered increasing Avapro but since it has been fine when he's checked at home and there is some increased erectile dysfunction would not make any changes at this time. #3 hyperlipidemia. Maintain Lipitor 20 mg by mouth. #4 erectile dysfunction. We'll give him the option to use Cialis 20 mg one half to one tablet as needed versus daily Cialis. Samples of the 20 given for him to try. Explained his insurance is going to pay for one. Testosterone level and PSA will be obtained. As well as a TSH. #5 GERD. Prevacid is working well. #6 tinea cruris. Nizoral cream twice a day for least 2 weeks warned the rash may come back.  #7 shoulder bursitis. Mobic 15 mg one tablet a day for 2 weeks and then use when necessary. If not better or improved return for possible bursa injection. #8 radial nerve impingement or entrapment. Mobic may help with that if not and if it gets worse may need nerve conduction study and possible orthopedic referral. Return in 6 months for followup.

## 2011-11-16 LAB — TESTOSTERONE, FREE, TOTAL, SHBG
Sex Hormone Binding: 20 nmol/L (ref 13–71)
Testosterone, Free: 82.3 pg/mL (ref 47.0–244.0)
Testosterone-% Free: 2.6 % (ref 1.6–2.9)
Testosterone: 321.3 ng/dL (ref 300–890)

## 2011-11-17 ENCOUNTER — Encounter: Payer: Self-pay | Admitting: Family Medicine

## 2011-11-22 ENCOUNTER — Encounter: Payer: Self-pay | Admitting: *Deleted

## 2011-12-13 ENCOUNTER — Encounter: Payer: Self-pay | Admitting: Family Medicine

## 2011-12-13 ENCOUNTER — Ambulatory Visit (INDEPENDENT_AMBULATORY_CARE_PROVIDER_SITE_OTHER): Payer: PRIVATE HEALTH INSURANCE | Admitting: Family Medicine

## 2011-12-13 VITALS — BP 147/95 | HR 86 | Ht 72.0 in | Wt 223.0 lb

## 2011-12-13 DIAGNOSIS — M25512 Pain in left shoulder: Secondary | ICD-10-CM

## 2011-12-13 DIAGNOSIS — M25519 Pain in unspecified shoulder: Secondary | ICD-10-CM

## 2011-12-13 MED ORDER — CELECOXIB 200 MG PO CAPS: 200.0000 mg | ORAL_CAPSULE | Freq: Every day | ORAL | Status: AC

## 2011-12-13 MED ORDER — HYDROCODONE-ACETAMINOPHEN 5-325 MG PO TABS: 1.0000 | ORAL_TABLET | ORAL | Status: AC | PRN

## 2011-12-13 MED ORDER — ORPHENADRINE CITRATE ER 100 MG PO TB12: 100.0000 mg | ORAL_TABLET | Freq: Two times a day (BID) | ORAL | Status: AC

## 2011-12-13 NOTE — Patient Instructions (Signed)
Shoulder Pain The shoulder is a ball and socket joint. The muscles and tendons (rotator cuff) are what keep the shoulder in its joint and stable. This collection of muscles and tendons holds in the head (ball) of the humerus (upper arm bone) in the fossa (cup) of the scapula (shoulder blade). Today no reason was found for your shoulder pain. Often pain in the shoulder may be treated conservatively with temporary immobilization. For example, holding the shoulder in one place using a sling for rest. Physical therapy may be needed if problems continue. HOME CARE INSTRUCTIONS   Apply ice to the sore area for 15 to 20 minutes, 3 to 4 times per day for the first 2 days. Put the ice in a plastic bag. Place a towel between the bag of ice and your skin.   If you have or were given a shoulder sling and straps, do not remove for as long as directed by your caregiver or until you see a caregiver for a follow-up examination. If you need to remove it to shower or bathe, move your arm as little as possible.   Sleep on several pillows at night to lessen swelling and pain.   Only take over-the-counter or prescription medicines for pain, discomfort, or fever as directed by your caregiver.   Keep any follow-up appointments in order to avoid any type of permanent shoulder disability or chronic pain problems.  SEEK MEDICAL CARE IF:   Pain in your shoulder increases or new pain develops in your arm, hand, or fingers.   Your hand or fingers are colder than your other hand.   You do not obtain pain relief with the medications or your pain becomes worse.  SEEK IMMEDIATE MEDICAL CARE IF:   Your arm, hand, or fingers are numb or tingling.   Your arm, hand, or fingers are swollen, painful, or turn white or blue.   You develop chest pain or shortness of breath.  MAKE SURE YOU:   Understand these instructions.   Will watch your condition.   Will get help right away if you are not doing well or get worse.    Document Released: 04/13/2005 Document Revised: 06/23/2011 Document Reviewed: 06/18/2011 [facility] Patient Information 2012 [place], [place].Achilles Tendinitis Tendinitis a swelling and soreness of the tendon. The pain in the tendon (cord-like structure which attaches muscle to bone) is produced by tiny tears and the inflammation present in that tendon. It commonly occurs at the shoulders, heels, and elbows. It is usually caused by overusing the tendon and joint involved. Achilles tendinitis involves the Achilles tendon. This is the large tendon in the back of the leg just above the foot. It attaches the large muscles of the lower leg to the heel bone (called calcaneus).  This diagnosis (learning what is wrong) is made by examination. X-rays will be generally be normal if only tendinitis is present. HOME CARE INSTRUCTIONS   Apply ice to the injury for 15 to 20 minutes, 3 to 4 times per day. Put the ice in a plastic bag and place a towel between the bag of ice and your skin.   Try to avoid use other than gentle range of motion while the tendon is painful. Do not resume use until instructed by your caregiver. Then begin use gradually. Do not increase use to the point of pain. If pain does develop, decrease use and continue the above measures. Gradually increase activities that do not cause discomfort until you gradually achieve normal use.   Only  take over-the-counter or prescription medicines for pain, discomfort, or fever as directed by your caregiver.  SEEK MEDICAL CARE IF:   Your pain and swelling increase or pain is uncontrolled with medications.   You develop new, unexplained problems (symptoms) or an increase of the symptoms that brought you to your caregiver.   You develop an inability to move your toes or foot, develop warmth and swelling in your foot, or begin running an unexplained temperature.  MAKE SURE YOU:   Understand these instructions.   Will watch your condition.   Will get  help right away if you are not doing well or get worse.  Document Released: 04/13/2005 Document Revised: 06/23/2011 Document Reviewed: 02/20/2008 [facility] Patient Information 2012 [place], [place].

## 2011-12-13 NOTE — Progress Notes (Signed)
  Subjective:    Patient ID: Adrian Mccarty, male    DOB: [birth year], 41 y.o.   MRN: [id number]  Shoulder Pain  The pain is present in the left shoulder. This is a recurrent problem. The current episode started in the past 7 days (had hx of shoulder pain but in the last 3-4 days it has gotten worse). There has been no history of extremity trauma. The problem occurs constantly. The problem has been gradually worsening. The quality of the pain is described as aching and sharp (dull before but now shooting type of pain). The pain is at a severity of 7/10. The pain is moderate. Associated symptoms include stiffness and tingling. Pertinent negatives include no fever, inability to bear weight, itching, joint locking, joint swelling or numbness. The symptoms are aggravated by activity. He has tried NSAIDS (mobic upset his stomach) for the symptoms. The treatment provided mild relief. Family history includes gout. Family history does not include rheumatoid arthritis. There is no history of diabetes, gout, osteoarthritis or rheumatoid arthritis.      Review of Systems  Constitutional: Negative for fever.  Musculoskeletal: Positive for stiffness. Negative for gout.  Skin: Negative for itching.  Neurological: Positive for tingling. Negative for numbness.  All other systems reviewed and are negative.      BP 147/95  Pulse 86  Ht 6' (1.829 m)  Wt 223 lb (101.152 kg)  BMI 30.24 kg/m2  SpO2 99% Objective:   Physical Exam  Vitals reviewed. Constitutional: He is oriented to person, place, and time. He appears well-developed and well-nourished.  HENT:  Head: Normocephalic.  Neck: Neck supple.  Musculoskeletal: Normal range of motion. He exhibits tenderness. He exhibits no edema.       L shoulder diffuse tenderness  Neurological: He is alert and oriented to person, place, and time.  Skin: Skin is warm and dry. No rash noted. No erythema.  Psychiatric: He has a normal mood and affect. His behavior is  normal.     Assessment & Plan:  Left shoulder pain/muscle spasm. Since patient is able to demonstrate a normal range of when necessary unlikely tear or dislocation is present. We'll give him an option to have an x-ray done if he would like one. Since Mobic caused GI upset we will try him on Celebrex 200 mg daily, Norflex muscle relaxant, and hydrocodone take at night for breakthrough pain. If his insurance would not pay for Celebrex he could try a half a tablet a once or twice a day with food. When necessary .

## 2012-02-13 ENCOUNTER — Ambulatory Visit: Payer: PRIVATE HEALTH INSURANCE | Admitting: Physician Assistant

## 2012-02-14 ENCOUNTER — Ambulatory Visit (INDEPENDENT_AMBULATORY_CARE_PROVIDER_SITE_OTHER): Payer: PRIVATE HEALTH INSURANCE | Admitting: Family Medicine

## 2012-02-14 ENCOUNTER — Encounter: Payer: Self-pay | Admitting: Family Medicine

## 2012-02-14 VITALS — BP 146/92 | HR 87 | Temp 98.4°F | Ht 72.0 in | Wt 222.0 lb

## 2012-02-14 DIAGNOSIS — J029 Acute pharyngitis, unspecified: Secondary | ICD-10-CM

## 2012-02-14 MED ORDER — FEXOFENADINE-PSEUDOEPHED ER 180-240 MG PO TB24: 1.0000 | ORAL_TABLET | Freq: Every day | ORAL | Status: DC

## 2012-02-14 MED ORDER — AMOXICILLIN-POT CLAVULANATE 875-125 MG PO TABS: 1.0000 | ORAL_TABLET | Freq: Two times a day (BID) | ORAL | Status: AC

## 2012-02-14 MED ORDER — FLUTICASONE PROPIONATE 50 MCG/ACT NA SUSP: 2.0000 | Freq: Every day | NASAL | Status: DC

## 2012-02-14 NOTE — Progress Notes (Signed)
  Subjective:    Patient ID: Adrian Mccarty, male    DOB: [birth year], 41 y.o.   MRN: [id number]  Sore Throat  This is a new problem. The current episode started in the past 7 days. The problem has been unchanged. The pain is worse on the right side. There has been no fever. The pain is at a severity of 5/10. The pain is mild. Associated symptoms include ear pain, headaches, neck pain, swollen glands and trouble swallowing. Pertinent negatives include no abdominal pain, congestion, coughing, diarrhea, drooling, ear discharge, hoarse voice, plugged ear sensation, shortness of breath, stridor or vomiting. He has had no exposure to strep or mono. He has tried acetaminophen and gargles for the symptoms. The treatment provided no relief.      Review of Systems  HENT: Positive for ear pain, trouble swallowing and neck pain. Negative for congestion, hoarse voice, drooling and ear discharge.   Respiratory: Negative for cough, shortness of breath and stridor.   Gastrointestinal: Negative for vomiting, abdominal pain and diarrhea.  Neurological: Positive for headaches.   BP 146/92  Pulse 87  Temp 98.4 F (36.9 C) (Oral)  Ht 6' (1.829 m)  Wt 222 lb (100.699 kg)  BMI 30.11 kg/m2  SpO2 99%       Objective:   Physical Exam  Constitutional: He is oriented to person, place, and time. He appears well-developed.  HENT:  Head: Normocephalic and atraumatic.  Right Ear: Hearing, tympanic membrane, external ear and ear canal normal.  Left Ear: Hearing, tympanic membrane, external ear and ear canal normal.  Nose: No mucosal edema or rhinorrhea.  Mouth/Throat: Normal dentition. No dental caries. Posterior oropharyngeal erythema present.       Both TMs were clear but there was tenderness along the right eustachian tube area from the ear to the jaw consistent with eustachian tube dysfunction  Neck: Normal range of motion. Neck supple. No tracheal deviation present.  Musculoskeletal: Normal range of motion.    Neurological: He is alert and oriented to person, place, and time.  Skin: Skin is warm and dry.  Psychiatric: He has a normal mood and affect.   Results for orders placed in visit on 02/14/12  POCT RAPID STREP A (OFFICE)      Component Value Range   Rapid Strep A Screen Negative  Negative      Assessment & Plan:  #1 eustachian tube dysfunction. We'll place on Augmentin 875 one tablet twice daily Allegra-D one tablet a day Flonase nasal spray 2 puffs each nostril daily return if not better in 2-10 days she starts improvement by then and return when necessary.

## 2012-02-14 NOTE — Patient Instructions (Signed)
Barotitis Media Barotitis media is soreness (inflammation) of the area behind the eardrum (middle ear). This occurs when the auditory tube (Eustachian tube) leading from the back of the throat to the eardrum is blocked. When it is blocked air cannot move in and out of the middle ear to equalize pressure changes. These pressure changes come from changes in altitude when:  Flying.   Driving in the mountains.   Diving.  Problems are more likely to occur with pressure changes during times when you are congested as from:  Hay fever.   Upper respiratory infection.   A cold.  Damage or hearing loss (barotrauma) caused by this may be permanent. HOME CARE INSTRUCTIONS   Use medicines as recommended by your caregiver. Over the counter medicines will help unblock the canal and can help during times of air travel.   Do not put anything into your ears to clean or unplug them. Eardrops will not be helpful.   Do not swim, dive, or fly until your caregiver says it is all right to do so. If these activities are necessary, chewing gum with frequent swallowing may help. It is also helpful to hold your nose and gently blow to pop your ears for equalizing pressure changes. This forces air into the Eustachian tube.   For little ones with problems, give your baby a bottle of water or juice during periods when pressure changes would be anticipated such as during take offs and landings associated with air travel.   Only take over-the-counter or prescription medicines for pain, discomfort, or fever as directed by your caregiver.   A decongestant may be helpful in de-congesting the middle ear and make pressure equalization easier. This can be even more effective if the drops (spray) are delivered with the head lying over the edge of a bed with the head tilted toward the ear on the affected side.   If your caregiver has given you a follow-up appointment, it is very important to keep that appointment. Not keeping  the appointment could result in a chronic or permanent injury, pain, hearing loss and disability. If there is any problem keeping the appointment, you must call back to this facility for assistance.  SEEK IMMEDIATE MEDICAL CARE IF:   You develop a severe headache, dizziness, severe ear pain, or bloody or pus-like drainage from your ears.   An oral temperature above 102 F (38.9 C) develops.   Your problems do not improve or become worse.  MAKE SURE YOU:   Understand these instructions.   Will watch your condition.   Will get help right away if you are not doing well or get worse.  Document Released: 07/01/2000 Document Revised: 06/23/2011 Document Reviewed: 02/07/2008 [employer] Patient Information 2012 [employer]. 

## 2012-04-10 ENCOUNTER — Ambulatory Visit: Payer: PRIVATE HEALTH INSURANCE | Admitting: Family Medicine

## 2012-04-10 DIAGNOSIS — Z0289 Encounter for other administrative examinations: Secondary | ICD-10-CM

## 2012-04-24 ENCOUNTER — Other Ambulatory Visit: Payer: Self-pay | Admitting: Family Medicine

## 2012-05-02 ENCOUNTER — Telehealth: Payer: Self-pay | Admitting: *Deleted

## 2012-05-02 DIAGNOSIS — N529 Male erectile dysfunction, unspecified: Secondary | ICD-10-CM

## 2012-05-02 NOTE — Telephone Encounter (Signed)
Pt calls and got his Cialis filled yesterday and wanted to know why he normally gets #30 and only got #6 this time

## 2012-05-03 MED ORDER — TADALAFIL 5 MG PO TABS: 5.0000 mg | ORAL_TABLET | Freq: Every day | ORAL | Status: DC | PRN

## 2012-05-03 NOTE — Telephone Encounter (Signed)
Pt notified he will check with pharmacy and see if insurance is only allowing 6 quantity. Given Cialis 5mg  samples #30

## 2012-07-17 ENCOUNTER — Ambulatory Visit (INDEPENDENT_AMBULATORY_CARE_PROVIDER_SITE_OTHER): Payer: PRIVATE HEALTH INSURANCE | Admitting: Family Medicine

## 2012-07-17 ENCOUNTER — Encounter: Payer: Self-pay | Admitting: Family Medicine

## 2012-07-17 VITALS — BP 149/78 | HR 90 | Temp 98.2°F | Resp 18 | Wt 220.0 lb

## 2012-07-17 DIAGNOSIS — J111 Influenza due to unidentified influenza virus with other respiratory manifestations: Secondary | ICD-10-CM

## 2012-07-17 DIAGNOSIS — I10 Essential (primary) hypertension: Secondary | ICD-10-CM

## 2012-07-17 DIAGNOSIS — R509 Fever, unspecified: Secondary | ICD-10-CM

## 2012-07-17 MED ORDER — OSELTAMIVIR PHOSPHATE 75 MG PO CAPS: 75.0000 mg | ORAL_CAPSULE | Freq: Two times a day (BID) | ORAL | Status: DC

## 2012-07-17 MED ORDER — HYDROCODONE-HOMATROPINE 5-1.5 MG/5ML PO SYRP: 5.0000 mL | ORAL_SOLUTION | Freq: Every evening | ORAL | Status: DC | PRN

## 2012-07-17 NOTE — Progress Notes (Signed)
  Subjective:    Patient ID: Adrian Mccarty, Adrian Mccarty    DOB: 06/23/1971, 41 y.o.   MRN: [id number]  HPI 3 days of cough, productive.  Feels it is getting worse.  Hot and cold.  Took a couple of advil and mucinex-D too. Helps some.  Last night his ears were aching.  Cough si worse at night.  ST initially but is now better.  Some mild congestion. No GI sxs.  Mild runny nose.  No flu shot. Has been having low grade fever. Fever to 102 yesterday.  Fever to 99 this AM.     Review of Systems     Objective:   Physical Exam  Constitutional: He is oriented to person, place, and time. He appears well-developed and well-nourished.  HENT:  Head: Normocephalic and atraumatic.  Right Ear: External ear normal.  Left Ear: External ear normal.  Nose: Nose normal.  Mouth/Throat: Oropharynx is clear and moist.       TMs and canals are clear.   Eyes: Conjunctivae normal and EOM are normal. Pupils are equal, round, and reactive to light.  Neck: Neck supple. No thyromegaly present.  Cardiovascular: Normal rate and normal heart sounds.   Pulmonary/Chest: Effort normal and breath sounds normal.  Lymphadenopathy:    He has no cervical adenopathy.  Neurological: He is alert and oriented to person, place, and time.  Skin: Skin is warm and dry.  Psychiatric: He has a normal mood and affect.          Assessment & Plan:  Inlfuenza. Test is +.  Discussed symptomatic care. Given rx for tamiflu.   Given prescription cough medication. Call if he is getting worse or if he's not significantly better by Monday of next week.  Hypertension-most likely elevated today because of the decongestant he has been taking. This is okay the swelling going to use it for a couple of days, but definitely do not use long-term.

## 2012-07-17 NOTE — Patient Instructions (Addendum)
Upper Respiratory Infection, Adult An upper respiratory infection (URI) is also sometimes known as the common cold. The upper respiratory tract includes the nose, sinuses, throat, trachea, and bronchi. Bronchi are the airways leading to the lungs. Most people improve within 1 week, but symptoms can last up to 2 weeks. A residual cough may last even longer.   CAUSES Many different viruses can infect the tissues lining the upper respiratory tract. The tissues become irritated and inflamed and often become very moist. Mucus production is also common. A cold is contagious. You can easily spread the virus to others by oral contact. This includes kissing, sharing a glass, coughing, or sneezing. Touching your mouth or nose and then touching a surface, which is then touched by another person, can also spread the virus. SYMPTOMS   Symptoms typically develop 1 to 3 days after you come in contact with a cold virus. Symptoms vary from person to person. They may include:  Runny nose.   Sneezing.   Nasal congestion.   Sinus irritation.   Sore throat.   Loss of voice (laryngitis).   Cough.   Fatigue.   Muscle aches.   Loss of appetite.   Headache.   Low-grade fever.  DIAGNOSIS   You might diagnose your own cold based on familiar symptoms, since most people get a cold 2 to 3 times a year. Your caregiver can confirm this based on your exam. Most importantly, your caregiver can check that your symptoms are not due to another disease such as strep throat, sinusitis, pneumonia, asthma, or epiglottitis. Blood tests, throat tests, and X-rays are not necessary to diagnose a common cold, but they may sometimes be helpful in excluding other more serious diseases. Your caregiver will decide if any further tests are required. RISKS AND COMPLICATIONS   You may be at risk for a more severe case of the common cold if you smoke cigarettes, have chronic heart disease (such as heart failure) or lung disease (such as  asthma), or if you have a weakened immune system. The very young and very old are also at risk for more serious infections. Bacterial sinusitis, middle ear infections, and bacterial pneumonia can complicate the common cold. The common cold can worsen asthma and chronic obstructive pulmonary disease (COPD). Sometimes, these complications can require emergency medical care and may be life-threatening. PREVENTION   The best way to protect against getting a cold is to practice good hygiene. Avoid oral or hand contact with people with cold symptoms. Wash your hands often if contact occurs. There is no clear evidence that vitamin C, vitamin E, echinacea, or exercise reduces the chance of developing a cold. However, it is always recommended to get plenty of rest and practice good nutrition. TREATMENT   Treatment is directed at relieving symptoms. There is no cure. Antibiotics are not effective, because the infection is caused by a virus, not by bacteria. Treatment may include:  Increased fluid intake. Sports drinks offer valuable electrolytes, sugars, and fluids.   Breathing heated mist or steam (vaporizer or shower).   Eating chicken soup or other clear broths, and maintaining good nutrition.   Getting plenty of rest.   Using gargles or lozenges for comfort.   Controlling fevers with ibuprofen or acetaminophen as directed by your caregiver.   Increasing usage of your inhaler if you have asthma.  Zinc gel and zinc lozenges, taken in the first 24 hours of the common cold, can shorten the duration and lessen the severity of symptoms. Pain  medicines may help with fever, muscle aches, and throat pain. A variety of non-prescription medicines are available to treat congestion and runny nose. Your caregiver can make recommendations and may suggest nasal or lung inhalers for other symptoms.   HOME CARE INSTRUCTIONS    Only take over-the-counter or prescription medicines for pain, discomfort, or fever as  directed by your caregiver.   Use a warm mist humidifier or inhale steam from a shower to increase air moisture. This may keep secretions moist and make it easier to breathe.   Drink enough water and fluids to keep your urine clear or pale yellow.   Rest as needed.   Return to work when your temperature has returned to normal or as your caregiver advises. You may need to stay home longer to avoid infecting others. You can also use a face mask and careful hand washing to prevent spread of the virus.  SEEK MEDICAL CARE IF:    After the first few days, you feel you are getting worse rather than better.   You need your caregiver's advice about medicines to control symptoms.   You develop chills, worsening shortness of breath, or brown or red sputum. These may be signs of pneumonia.   You develop yellow or brown nasal discharge or pain in the face, especially when you bend forward. These may be signs of sinusitis.   You develop a fever, swollen neck glands, pain with swallowing, or white areas in the back of your throat. These may be signs of strep throat.  SEEK IMMEDIATE MEDICAL CARE IF:    You have a fever.   You develop severe or persistent headache, ear pain, sinus pain, or chest pain.   You develop wheezing, a prolonged cough, cough up blood, or have a change in your usual mucus (if you have chronic lung disease).   You develop sore muscles or a stiff neck.  Document Released: 12/28/2000 Document Revised: 09/26/2011 Document Reviewed: 11/05/2010 [facility] Patient Information 2013 [place], [place].   Influenza, Adult Influenza (flu) is an infection in the mouth, nose, and throat (respiratory tract) caused by a virus. The flu can make you feel very ill. Influenza spreads easily from person to person (contagious).   HOME CARE    Only take medicines as told by your doctor.   Use a cool mist humidifier to make breathing easier.   Get plenty of rest until your fever goes away. This  usually takes 3 to 4 days.   Drink enough fluids to keep your pee (urine) clear or pale yellow.   Cover your mouth and nose when you cough or sneeze.   Wash your hands well to avoid spreading the flu.   Stay home from work or school until your fever has been gone for at least 1 full day.   Get a flu shot every year.  GET HELP RIGHT AWAY IF:    You have trouble breathing or feel short of breath.   Your skin or nails turn blue.   You have severe neck pain or stiffness.   You have a severe headache, facial pain, or earache.   Your fever gets worse or keeps coming back.   You feel sick to your stomach (nauseous), throw up (vomit), or have watery poop (diarrhea).   You have chest pain.   You have a deep cough that gets worse, or you cough up more thick spit (mucus).  MAKE SURE YOU:    Understand these instructions.   Will watch  your condition.   Will get help right away if you are not doing well or get worse.  Document Released: 04/12/2008 Document Revised: 01/03/2012 Document Reviewed: 10/03/2011 [facility] Patient Information 2013 [place], [place].   Influenza, Adult Influenza (flu) is an infection in the mouth, nose, and throat (respiratory tract) caused by a virus. The flu can make you feel very ill. Influenza spreads easily from person to person (contagious).   HOME CARE    Only take medicines as told by your doctor.   Use a cool mist humidifier to make breathing easier.   Get plenty of rest until your fever goes away. This usually takes 3 to 4 days.   Drink enough fluids to keep your pee (urine) clear or pale yellow.   Cover your mouth and nose when you cough or sneeze.   Wash your hands well to avoid spreading the flu.   Stay home from work or school until your fever has been gone for at least 1 full day.   Get a flu shot every year.  GET HELP RIGHT AWAY IF:    You have trouble breathing or feel short of breath.   Your skin or nails turn blue.   You have  severe neck pain or stiffness.   You have a severe headache, facial pain, or earache.   Your fever gets worse or keeps coming back.   You feel sick to your stomach (nauseous), throw up (vomit), or have watery poop (diarrhea).   You have chest pain.   You have a deep cough that gets worse, or you cough up more thick spit (mucus).  MAKE SURE YOU:    Understand these instructions.   Will watch your condition.   Will get help right away if you are not doing well or get worse.  Document Released: 04/12/2008 Document Revised: 01/03/2012 Document Reviewed: 10/03/2011 [facility] Patient Information 2013 [place], [place].

## 2012-07-23 ENCOUNTER — Telehealth: Payer: Self-pay

## 2012-07-23 NOTE — Telephone Encounter (Signed)
Patient advised.

## 2012-07-23 NOTE — Telephone Encounter (Signed)
Agree with the care plan. Unfortunately after an acute infection as the lungs and cilia are repairing it can cause a cough that lingers for several weeks. Make sure staying well hydrated. Mucinex is excellent. If we need to call something in like Tessalon Perles then please let me know.

## 2012-07-23 NOTE — Telephone Encounter (Signed)
Adrian Mccarty complains of non stop cough. He was seen last week and diagnosed with Flu and given a cough medication. I did advise the patient that this is a typical symptom for those who has the flu and per Dr Linford Arnold states this is the healing process. I advised him to take mucinex D OTC 12 hour twice daily. Please advise if you agree or have other recommendations.

## 2012-09-28 ENCOUNTER — Other Ambulatory Visit: Payer: Self-pay | Admitting: *Deleted

## 2012-11-21 ENCOUNTER — Other Ambulatory Visit: Payer: Self-pay | Admitting: *Deleted

## 2012-11-21 DIAGNOSIS — N529 Male erectile dysfunction, unspecified: Secondary | ICD-10-CM

## 2012-11-21 MED ORDER — TADALAFIL 5 MG PO TABS: 5.0000 mg | ORAL_TABLET | Freq: Every day | ORAL | Status: DC | PRN

## 2013-01-29 ENCOUNTER — Encounter: Payer: Self-pay | Admitting: Family Medicine

## 2013-01-29 ENCOUNTER — Ambulatory Visit (INDEPENDENT_AMBULATORY_CARE_PROVIDER_SITE_OTHER): Payer: PRIVATE HEALTH INSURANCE | Admitting: Family Medicine

## 2013-01-29 VITALS — BP 141/87 | HR 101 | Wt 218.0 lb

## 2013-01-29 DIAGNOSIS — I10 Essential (primary) hypertension: Secondary | ICD-10-CM

## 2013-01-29 DIAGNOSIS — N529 Male erectile dysfunction, unspecified: Secondary | ICD-10-CM

## 2013-01-29 DIAGNOSIS — Z79899 Other long term (current) drug therapy: Secondary | ICD-10-CM

## 2013-01-29 DIAGNOSIS — E7800 Pure hypercholesterolemia, unspecified: Secondary | ICD-10-CM

## 2013-01-29 DIAGNOSIS — Z5181 Encounter for therapeutic drug level monitoring: Secondary | ICD-10-CM

## 2013-01-29 DIAGNOSIS — E785 Hyperlipidemia, unspecified: Secondary | ICD-10-CM

## 2013-01-29 LAB — COMPLETE METABOLIC PANEL WITH GFR
ALT: 21 U/L (ref 0–53)
AST: 22 U/L (ref 0–37)
Albumin: 5.1 g/dL (ref 3.5–5.2)
Alkaline Phosphatase: 64 U/L (ref 39–117)
Glucose, Bld: 103 mg/dL — ABNORMAL HIGH (ref 70–99)
Potassium: 4.8 mEq/L (ref 3.5–5.3)
Sodium: 139 mEq/L (ref 135–145)
Total Protein: 7.7 g/dL (ref 6.0–8.3)

## 2013-01-29 LAB — LIPID PANEL
LDL Cholesterol: 129 mg/dL — ABNORMAL HIGH (ref 0–99)
Total CHOL/HDL Ratio: 4 Ratio
VLDL: 30 mg/dL (ref 0–40)

## 2013-01-29 MED ORDER — TADALAFIL 5 MG PO TABS: 5.0000 mg | ORAL_TABLET | Freq: Every day | ORAL | Status: DC | PRN

## 2013-01-29 MED ORDER — LOSARTAN POTASSIUM-HCTZ 100-25 MG PO TABS: 1.0000 | ORAL_TABLET | Freq: Every day | ORAL | Status: DC

## 2013-01-29 MED ORDER — ATORVASTATIN CALCIUM 20 MG PO TABS: 20.0000 mg | ORAL_TABLET | Freq: Every day | ORAL | Status: DC

## 2013-01-29 NOTE — Progress Notes (Signed)
CC: Adrian Mccarty is a 42 y.o. male is here for Hyperlipidemia and Erectile Dysfunction   Subjective: HPI:  Very pleasant former patient of Dr. Thurmond Mccarty  Followup erectile dysfunction: Patient has been using 5 mg Cialis 30 minutes prior to sexual activity has had subjective improvement and no longer having erectile issues initiating or maintaining. He tried stopping this many months ago had return of both initiating and maintaining erection. Nothing else seems to make symptoms better or worse. He denies testicular pain, swelling, penile lesions, discharge nor dysuria.  Followup hypertension: He has been taking Avapro on a daily basis without missed doses no outside blood pressures to report. He does strength training and aerobic exercise most days of the week for the past 6 months. He believes strongly he consumes less than 2000 mg sodium on a daily basis. He denies chest pain, shortness of breath, orthopnea, peripheral edema, motor or sensory disturbances  Followup hypercholesterolemia: He has been taking Lipitor on a daily basis without missed doses denies right upper quadrant pain, myalgias, skin or scleral discoloration    Review Of Systems Outlined In HPI  Past Medical History  Diagnosis Date  . Hypertension   . Hyperlipidemia   . Erectile dysfunction   . GERD (gastroesophageal reflux disease)      Family History  Problem Relation Age of Onset  . Heart disease Mother   . Hypertension Father   . Heart disease Maternal Grandfather   . Heart disease Paternal Grandfather      History  Substance Use Topics  . Smoking status: Former Games developer  . Smokeless tobacco: Current User    Types: Chew  . Alcohol Use: 3.6 oz/week    6 Cans of beer per week     Objective: Filed Vitals:   01/29/13 0911  BP: 141/87  Pulse: 101    General: Alert and Oriented, No Acute Distress HEENT: Pupils equal, round, reactive to light. Conjunctivae clear.  Moist mucous membranes pharynx  unremarkable Lungs: Clear to auscultation bilaterally, no wheezing/ronchi/rales.  Comfortable work of breathing. Good air movement. Cardiac: Regular rate and rhythm. Normal S1/S2.  No murmurs, rubs, nor gallops.   Abdomen: Soft nontender to palpation Extremities: No peripheral edema.  Strong peripheral pulses.  Mental Status: No depression, anxiety, nor agitation. Skin: Warm and dry.  Assessment & Plan: Adrian Mccarty was seen today for hyperlipidemia and erectile dysfunction.  Diagnoses and associated orders for this visit:  Erectile dysfunction - tadalafil (CIALIS) 5 MG tablet; Take 1 tablet (5 mg total) by mouth daily as needed for erectile dysfunction.  Hypertension - COMPLETE METABOLIC PANEL WITH GFR - losartan-hydrochlorothiazide (HYZAAR) 100-25 MG per tablet; Take 1 tablet by mouth daily.  HYPERCHOLESTEROLEMIA - Lipid panel  Encounter for monitoring statin therapy - COMPLETE METABOLIC PANEL WITH GFR  Hyperlipemia - atorvastatin (LIPITOR) 20 MG tablet; Take 1 tablet (20 mg total) by mouth daily.    Erectile dysfunction: Chronic controlled condition continue Cialis Hypertension: Chronic uncontrolled condition, we will switch his ARB and add hydrochlorothiazide, I've asked him to return in 4 weeks for reassessment of blood pressure, we'll screen for renal dysfunction. Hyperlipidemia: Clinically controlled due for cholesterol check along with liver enzymes  Return in about 4 weeks (around 02/26/2013).

## 2013-01-30 ENCOUNTER — Encounter: Payer: Self-pay | Admitting: Family Medicine

## 2013-03-01 ENCOUNTER — Ambulatory Visit: Payer: PRIVATE HEALTH INSURANCE | Admitting: Family Medicine

## 2013-07-25 ENCOUNTER — Other Ambulatory Visit: Payer: Self-pay | Admitting: *Deleted

## 2013-07-25 DIAGNOSIS — E785 Hyperlipidemia, unspecified: Secondary | ICD-10-CM

## 2013-07-25 DIAGNOSIS — N529 Male erectile dysfunction, unspecified: Secondary | ICD-10-CM

## 2013-07-25 MED ORDER — TADALAFIL 5 MG PO TABS: 5.0000 mg | ORAL_TABLET | Freq: Every day | ORAL | Status: DC | PRN

## 2013-11-01 LAB — LIPID PANEL
Cholesterol: 189 mg/dL (ref 0–200)
HDL: 60 mg/dL (ref 35–70)
LDL Cholesterol: 106 mg/dL
Triglycerides: 113 mg/dL (ref 40–160)

## 2013-11-01 LAB — HEPATIC FUNCTION PANEL
ALT: 23 U/L (ref 10–40)
AST: 19 U/L (ref 14–40)

## 2013-11-01 LAB — BASIC METABOLIC PANEL
Creatinine: 1 mg/dL (ref 0.6–1.3)
Glucose: 84 mg/dL

## 2013-11-01 LAB — HEMOGLOBIN A1C: HEMOGLOBIN A1C: 5.4 % (ref 4.0–6.0)

## 2013-12-03 ENCOUNTER — Encounter: Payer: Self-pay | Admitting: Family Medicine

## 2013-12-03 ENCOUNTER — Ambulatory Visit (INDEPENDENT_AMBULATORY_CARE_PROVIDER_SITE_OTHER): Payer: PRIVATE HEALTH INSURANCE | Admitting: Family Medicine

## 2013-12-03 VITALS — BP 144/88 | HR 84 | Wt 217.0 lb

## 2013-12-03 DIAGNOSIS — M545 Low back pain, unspecified: Secondary | ICD-10-CM

## 2013-12-03 DIAGNOSIS — N529 Male erectile dysfunction, unspecified: Secondary | ICD-10-CM

## 2013-12-03 MED ORDER — ORPHENADRINE CITRATE ER 100 MG PO TB12: 100.0000 mg | ORAL_TABLET | Freq: Two times a day (BID) | ORAL | Status: DC | PRN

## 2013-12-03 MED ORDER — MELOXICAM 15 MG PO TABS: 15.0000 mg | ORAL_TABLET | Freq: Every day | ORAL | Status: DC

## 2013-12-03 MED ORDER — TADALAFIL 5 MG PO TABS: 5.0000 mg | ORAL_TABLET | Freq: Every day | ORAL | Status: DC | PRN

## 2013-12-03 MED ORDER — HYDROCODONE-ACETAMINOPHEN 10-325 MG PO TABS: 0.5000 | ORAL_TABLET | Freq: Every evening | ORAL | Status: DC | PRN

## 2013-12-03 NOTE — Progress Notes (Signed)
CC: Adrian Mccarty is a 43 y.o. male is here for Back Pain   Subjective: HPI:  Implants of low back pain that is localized to the left and right lower lumbar region that is described as a tightness, moderate in severity, improves with physical activity, worse with sedentary activity. Nonradiating. Came on one day after lifting a heavy generator, denies pain immediately when he was lifting this. Interventions have included acetaminophen with no improvement. Other than above nothing particularly makes better or worse. Denies midline back pain, saddle paresthesia, bowel or bladder incontinence, weakness in either lower extremity, nor motor or sensory disturbances.  Complains of continued erectile dysfunction which is improved whenever he takes 5-10 mg of Cialis. He stopped taking it for a little over a month and has had recurrent difficulty with maintaining erection but no difficulty initiating. He continues to have spontaneous nocturnal erections. He reports good libido without any genitourinary complaints other than above.  Review Of Systems Outlined In HPI  Past Medical History  Diagnosis Date  . Hypertension   . Hyperlipidemia   . Erectile dysfunction   . GERD (gastroesophageal reflux disease)     No past surgical history on file. Family History  Problem Relation Age of Onset  . Heart disease Mother   . Hypertension Father   . Heart disease Maternal Grandfather   . Heart disease Paternal Grandfather     History   Social History  . Marital Status: Married    Spouse Name: N/A    Number of Children: N/A  . Years of Education: N/A   Occupational History  . Not on file.   Social History Main Topics  . Smoking status: Former Research scientist (life sciences)  . Smokeless tobacco: Current User    Types: Chew  . Alcohol Use: 3.6 oz/week    6 Cans of beer per week  . Drug Use: Not on file  . Sexual Activity: Yes    Birth Control/ Protection: Surgical   Other Topics Concern  . Not on file   Social  History Narrative  . No narrative on file     Objective: BP 144/88  Pulse 84  Wt 217 lb (98.431 kg)  General: Alert and Oriented, No Acute Distress HEENT: Pupils equal, round, reactive to light. Conjunctivae clear.  Moist mucous membranes pharynx unremarkable Lungs: Clear to auscultation bilaterally, no wheezing/ronchi/rales.  Comfortable work of breathing. Good air movement. Cardiac: Regular rate and rhythm. Normal S1/S2.  No murmurs, rubs, nor gallops.   Back: No midline spinous process tenderness in the lumbar region however pain is reproduced with both left and right paraspinal musculature palpation in the lumbar region. Straight leg raise negative bilaterally, FABER negative bilaterally, pain is improved with flexion of the lumbar spine worse with extension. Extremities: No peripheral edema.  Strong peripheral pulses. Full range of motion strength in both lower extremities with L4 and S1 DTRs two over four bilaterally Mental Status: No depression, anxiety, nor agitation. Skin: Warm and dry.  Assessment & Plan: Adrian Mccarty was seen today for back pain.  Diagnoses and associated orders for this visit:  Erectile dysfunction - tadalafil (CIALIS) 5 MG tablet; Take 1 tablet (5 mg total) by mouth daily as needed for erectile dysfunction.  Low back pain - meloxicam (MOBIC) 15 MG tablet; Take 1 tablet (15 mg total) by mouth daily. - orphenadrine (NORFLEX) 100 MG tablet; Take 1 tablet (100 mg total) by mouth 2 (two) times daily as needed (low back pain). - HYDROcodone-acetaminophen (NORCO) 10-325 MG per tablet;  Take 0.5-1 tablets by mouth at bedtime as needed.    Low back pain is locally due to muscular strain therefore start meloxicam daily as needed Norflex and Norco to help with sleep, sedation warning was provided. Erectile dysfunction: Suspected there is a large component of his blood pressure medication contributing to this issue, as it is uncontrolled right now restart as needed  Cialis. Return at his convenience if he is interested in trying alternate blood pressure medications.  He tells me that blood pressure checked at his work last week was 113/70.   Return if symptoms worsen or fail to improve.

## 2013-12-04 ENCOUNTER — Encounter: Payer: Self-pay | Admitting: *Deleted

## 2014-01-08 ENCOUNTER — Encounter: Payer: Self-pay | Admitting: Sports Medicine

## 2014-01-08 ENCOUNTER — Ambulatory Visit (INDEPENDENT_AMBULATORY_CARE_PROVIDER_SITE_OTHER): Payer: PRIVATE HEALTH INSURANCE | Admitting: Sports Medicine

## 2014-01-08 VITALS — BP 150/83 | HR 84 | Ht 72.0 in | Wt 224.0 lb

## 2014-01-08 DIAGNOSIS — S92309A Fracture of unspecified metatarsal bone(s), unspecified foot, initial encounter for closed fracture: Secondary | ICD-10-CM

## 2014-01-08 DIAGNOSIS — S92302A Fracture of unspecified metatarsal bone(s), left foot, initial encounter for closed fracture: Secondary | ICD-10-CM | POA: Insufficient documentation

## 2014-01-08 MED ORDER — HYDROCODONE-ACETAMINOPHEN 10-325 MG PO TABS: 1.0000 | ORAL_TABLET | Freq: Three times a day (TID) | ORAL | Status: DC | PRN

## 2014-01-08 NOTE — Assessment & Plan Note (Signed)
6 days post fracture of the left second, third, and fourth metatarsal next period nondisplaced. Still swollen and with a cut on his ankle, this precludes [place] putting a cast with has healed. Increasing hydrocodone to 10/325, continue Cam Boot until next week, then we can likely put on a green cast. X-rays before visit.  I billed a fracture code for this visit, all subsequent visits for this complaint will be "post-op checks" in the global period.

## 2014-01-08 NOTE — Progress Notes (Signed)
   Subjective:    I'm seeing this patient as a consultation for:  Dr. Ile[facility]  CC: Foot fracture  HPI: This is a pleasant 42 year old male, 6 days ago he slipped and injured his left foot. [place] x[place]ray showed several fractures, he was placed in a boot, given hydrocodone 5/325 and told to followup with me for further evaluation and definitive treatment. Pain is moderate, persistent, still has significant swelling.  Past medical history, Surgical history, Family history not pertinant except as noted below, Social history, Allergies, and medications have been entered into the medical record, reviewed, and no changes needed.   Review of Systems: No headache, visual changes, nausea, vomiting, diarrhea, constipation, dizziness, abdominal pain, skin rash, fevers, chills, night sweats, weight loss, swollen lymph nodes, body aches, joint swelling, muscle aches, chest pain, shortness of breath, mood changes, visual or auditory hallucinations.   Objective:   General: Well Developed, well nourished, and in no acute distress.  Neuro/Psych: Alert and oriented x3, extra-ocular muscles intact, able to move all 4 extremities, sensation grossly intact. Skin: Warm and dry, no rashes noted.  Respiratory: Not using accessory muscles, speaking in full sentences, trachea midline.  Cardiovascular: Pulses palpable, no extremity edema. Abdomen: Does not appear distended. Left foot: sore, bruised, tender to palpation at the neck of the second third and fourth metatarsals. There is a small laceration on the anterior ankle that is neither indurated, nor with erythema.  X-rays reviewed and showed transverse fracture through the neck of the second, third, and fourth metatarsals, non-angulated, nondisplaced.  Impression and Recommendations:   This case required medical decision making of moderate complexity.

## 2014-01-15 ENCOUNTER — Ambulatory Visit (INDEPENDENT_AMBULATORY_CARE_PROVIDER_SITE_OTHER): Payer: PRIVATE HEALTH INSURANCE | Admitting: Sports Medicine

## 2014-01-15 ENCOUNTER — Encounter: Payer: Self-pay | Admitting: Sports Medicine

## 2014-01-15 ENCOUNTER — Ambulatory Visit (INDEPENDENT_AMBULATORY_CARE_PROVIDER_SITE_OTHER): Payer: PRIVATE HEALTH INSURANCE

## 2014-01-15 VITALS — BP 133/74 | HR 87 | Ht 72.0 in | Wt 213.0 lb

## 2014-01-15 DIAGNOSIS — S92302A Fracture of unspecified metatarsal bone(s), left foot, initial encounter for closed fracture: Secondary | ICD-10-CM

## 2014-01-15 DIAGNOSIS — IMO0001 Reserved for inherently not codable concepts without codable children: Secondary | ICD-10-CM

## 2014-01-15 DIAGNOSIS — S92302D Fracture of unspecified metatarsal bone(s), left foot, subsequent encounter for fracture with routine healing: Secondary | ICD-10-CM

## 2014-01-15 DIAGNOSIS — S92309A Fracture of unspecified metatarsal bone(s), unspecified foot, initial encounter for closed fracture: Secondary | ICD-10-CM

## 2014-01-15 MED ORDER — DOXYCYCLINE HYCLATE 100 MG PO TABS: 100.0000 mg | ORAL_TABLET | Freq: Two times a day (BID) | ORAL | Status: AC

## 2014-01-15 MED ORDER — HYDROCODONE-ACETAMINOPHEN 10-325 MG PO TABS: 1.0000 | ORAL_TABLET | Freq: Three times a day (TID) | ORAL | Status: DC | PRN

## 2014-01-15 NOTE — Assessment & Plan Note (Signed)
Short-leg cast as above, nonweightbearing for one and a half weeks, may then transition into Cam Boot on cast if no pain with weightbearing. Refilling narcotics, adding a short course of doxycycline for the abrasion on his shin area

## 2014-01-15 NOTE — Progress Notes (Signed)
  Subjective: 2 weeks post fractures of the second, third, and fourth metatarsal necks of the left foot. Cast placement today   Objective: General: Well-developed, well-nourished, and in no acute distress. Left foot: Swelling and bruising has resolved, tender to palpation over the metatarsal necks. There is a small laceration with only mild erythema, and no signs of infection on the anterior shin.  Short-leg cast was placed.  Assessment/plan:

## 2014-02-12 ENCOUNTER — Ambulatory Visit (INDEPENDENT_AMBULATORY_CARE_PROVIDER_SITE_OTHER): Payer: PRIVATE HEALTH INSURANCE | Admitting: Sports Medicine

## 2014-02-12 ENCOUNTER — Encounter: Payer: Self-pay | Admitting: Sports Medicine

## 2014-02-12 ENCOUNTER — Ambulatory Visit (INDEPENDENT_AMBULATORY_CARE_PROVIDER_SITE_OTHER): Payer: PRIVATE HEALTH INSURANCE

## 2014-02-12 VITALS — BP 133/87 | HR 77 | Temp 98.1°F | Wt 221.0 lb

## 2014-02-12 DIAGNOSIS — IMO0001 Reserved for inherently not codable concepts without codable children: Secondary | ICD-10-CM

## 2014-02-12 DIAGNOSIS — S92302D Fracture of unspecified metatarsal bone(s), left foot, subsequent encounter for fracture with routine healing: Secondary | ICD-10-CM

## 2014-02-12 DIAGNOSIS — S92309A Fracture of unspecified metatarsal bone(s), unspecified foot, initial encounter for closed fracture: Secondary | ICD-10-CM

## 2014-02-12 NOTE — Assessment & Plan Note (Signed)
Back into walking boot, x-rays, compressive bandage. Return in 2 weeks.

## 2014-02-12 NOTE — Progress Notes (Signed)
  Subjective: 6 weeks post fractures, transverse, second, third, and fourth metatarsal necks. Has been in a cast. Doing well.   Objective: General: Well-developed, well-nourished, and in no acute distress. Cast is removed, still tender to palpation over the fracture site.  Strap with compressive dressing. Assessment/plan:

## 2014-02-27 ENCOUNTER — Ambulatory Visit (INDEPENDENT_AMBULATORY_CARE_PROVIDER_SITE_OTHER): Payer: PRIVATE HEALTH INSURANCE

## 2014-02-27 ENCOUNTER — Ambulatory Visit (INDEPENDENT_AMBULATORY_CARE_PROVIDER_SITE_OTHER): Payer: PRIVATE HEALTH INSURANCE | Admitting: Sports Medicine

## 2014-02-27 ENCOUNTER — Encounter: Payer: Self-pay | Admitting: Sports Medicine

## 2014-02-27 VITALS — BP 166/103 | HR 79 | Ht 73.0 in | Wt 219.0 lb

## 2014-02-27 DIAGNOSIS — S92302D Fracture of unspecified metatarsal bone(s), left foot, subsequent encounter for fracture with routine healing: Secondary | ICD-10-CM

## 2014-02-27 DIAGNOSIS — I10 Essential (primary) hypertension: Secondary | ICD-10-CM

## 2014-02-27 DIAGNOSIS — S92302A Fracture of unspecified metatarsal bone(s), left foot, initial encounter for closed fracture: Secondary | ICD-10-CM

## 2014-02-27 DIAGNOSIS — IMO0001 Reserved for inherently not codable concepts without codable children: Secondary | ICD-10-CM

## 2014-02-27 DIAGNOSIS — S92309A Fracture of unspecified metatarsal bone(s), unspecified foot, initial encounter for closed fracture: Secondary | ICD-10-CM

## 2014-02-27 MED ORDER — HYDROCODONE-ACETAMINOPHEN 10-325 MG PO TABS: 1.0000 | ORAL_TABLET | Freq: Three times a day (TID) | ORAL | Status: DC | PRN

## 2014-02-27 MED ORDER — LOSARTAN POTASSIUM-HCTZ 100-25 MG PO TABS: 1.0000 | ORAL_TABLET | Freq: Every day | ORAL | Status: DC

## 2014-02-27 NOTE — Progress Notes (Signed)
  Subjective: 8 weeks post transverse fracture through the second, third, and fourth metatarsal necks, did well after removing the cast, wore the boot for a week and then overdid it. Still with some pain over the fracture site.   Objective: General: Well-developed, well-nourished, and in no acute distress. Left foot: Tender to palpation at the metatarsal necks of the second, third, and fourth metatarsals with mild swelling, neurovascularly intact distally.  X-rays show good stability and excellent bony callus formation.  Assessment/plan:

## 2014-02-27 NOTE — Assessment & Plan Note (Signed)
8 weeks out, still with pain, this time we will continue the boot for an additional month. Return to see me, repeating x-rays today.

## 2014-03-28 ENCOUNTER — Ambulatory Visit (INDEPENDENT_AMBULATORY_CARE_PROVIDER_SITE_OTHER): Payer: PRIVATE HEALTH INSURANCE | Admitting: Family Medicine

## 2014-03-28 ENCOUNTER — Encounter: Payer: Self-pay | Admitting: Family Medicine

## 2014-03-28 VITALS — BP 134/86 | HR 92 | Wt 215.0 lb

## 2014-03-28 DIAGNOSIS — M76892 Other specified enthesopathies of left lower limb, excluding foot: Secondary | ICD-10-CM

## 2014-03-28 DIAGNOSIS — E785 Hyperlipidemia, unspecified: Secondary | ICD-10-CM

## 2014-03-28 DIAGNOSIS — I10 Essential (primary) hypertension: Secondary | ICD-10-CM

## 2014-03-28 DIAGNOSIS — IMO0002 Reserved for concepts with insufficient information to code with codable children: Secondary | ICD-10-CM

## 2014-03-28 DIAGNOSIS — N529 Male erectile dysfunction, unspecified: Secondary | ICD-10-CM

## 2014-03-28 DIAGNOSIS — E7800 Pure hypercholesterolemia, unspecified: Secondary | ICD-10-CM

## 2014-03-28 MED ORDER — ATORVASTATIN CALCIUM 20 MG PO TABS: 20.0000 mg | ORAL_TABLET | Freq: Every day | ORAL | Status: DC

## 2014-03-28 MED ORDER — TADALAFIL 5 MG PO TABS: 5.0000 mg | ORAL_TABLET | Freq: Every day | ORAL | Status: DC | PRN

## 2014-03-28 MED ORDER — LOSARTAN POTASSIUM-HCTZ 100-25 MG PO TABS: 1.0000 | ORAL_TABLET | Freq: Every day | ORAL | Status: DC

## 2014-03-28 NOTE — Progress Notes (Signed)
CC: Adrian Mccarty is a 43 y.o. male is here for f/u hypertension and f/u chol   Subjective: HPI:  Followup hyperlipidemia: Continues to take Lipitor a daily basis without noted side effects. Denies right upper quadrant pain or myalgias.  Cholesterol was checked back in the spring and was acceptable. Denies chest pain and claudication nor motor or sensory disturbances  Followup essential hypertension: Had run out of blood pressure medication back in August when his blood pressure was taken in our sports medicine clinic.  Since then he has restarted losartan/hydrochlorothiazide without known side effects or any outside blood pressures to report. Denies chest pain orthopnea peripheral edema nor motor or sensory disturbances  Requesting refills on Cialis and he takes on an as-needed basis for erectile dysfunction. Provided he takes this he denies any difficulty with initiating or maintaining erection And denies any side effects.  he goes to the gym most days of the week and never gets chest pain when exerting himself    complaint of left knee pain described as an aching in the back of the knee that is worse at the end of the day after walking symptoms have been present for the past 2-3 weeks mild in severity nothing particularly makes them better. No interventions as of yet. denies locking catching or giving way of the knee. Denies any swelling redness or warmth of the knee   Review Of Systems Outlined In HPI  Past Medical History  Diagnosis Date  . Hypertension   . Hyperlipidemia   . Erectile dysfunction   . GERD (gastroesophageal reflux disease)     No past surgical history on file. Family History  Problem Relation Age of Onset  . Heart disease Mother   . Hypertension Father   . Heart disease Maternal Grandfather   . Heart disease Paternal Grandfather     History   Social History  . Marital Status: Married    Spouse Name: N/A    Number of Children: N/A  . Years of Education: N/A    Occupational History  . Not on file.   Social History Main Topics  . Smoking status: Former Research scientist (life sciences)  . Smokeless tobacco: Current User    Types: Chew  . Alcohol Use: 3.6 oz/week    6 Cans of beer per week  . Drug Use: Not on file  . Sexual Activity: Yes    Birth Control/ Protection: Surgical   Other Topics Concern  . Not on file   Social History Narrative  . No narrative on file     Objective: BP 134/86  Pulse 92  Wt 215 lb (97.523 kg)  General: Alert and Oriented, No Acute Distress HEENT: Pupils equal, round, reactive to light. Conjunctivae clear.  moist mucous membranes pharynx unremarkable  Lungs: Clear to auscultation bilaterally, no wheezing/ronchi/rales.  Comfortable work of breathing. Good air movement. Cardiac: Regular rate and rhythm. Normal S1/S2.  No murmurs, rubs, nor gallops.   Left knee exam shows full-strength and range of motion. There is no swelling, redness, nor warmth overlying the knee.  No patellar crepitus. No patellar apprehension. No pain with palpation of the inferior patellar pole.  No pain or laxity with valgus nor varus stress. Anterior drawer is negative. McMurray's negative. No popliteal space palpable mass however pain is reproduced with palpation of distal hamstrings particularly at the tendons wrapping around to inferior medial knee. No medial or lateral joint line tenderness to palpation. Extremities: No peripheral edema.  Strong peripheral pulses.  Mental Status: No  depression, anxiety, nor agitation. Skin: Warm and dry.  Assessment & Plan: Adrian Mccarty was seen today for f/u hypertension and f/u chol.  Diagnoses and associated orders for this visit:  HYPERCHOLESTEROLEMIA  Essential hypertension - losartan-hydrochlorothiazide (HYZAAR) 100-25 MG per tablet; Take 1 tablet by mouth daily.  Pes anserinus tendinitis or bursitis, left  Hyperlipemia - Discontinue: atorvastatin (LIPITOR) 20 MG tablet; Take 1 tablet (20 mg total) by mouth  daily. - atorvastatin (LIPITOR) 20 MG tablet; Take 1 tablet (20 mg total) by mouth daily.  Erectile dysfunction, unspecified erectile dysfunction type - Discontinue: tadalafil (CIALIS) 5 MG tablet; Take 1 tablet (5 mg total) by mouth daily as needed for erectile dysfunction. - tadalafil (CIALIS) 5 MG tablet; Take 1 tablet (5 mg total) by mouth daily as needed for erectile dysfunction.    hypertension: Controlled continue losartan and hydrochlorothiazide  bursitis of the right knee: He was given a handout with instructions to perform daily rehabilitative stretches and exercises for the left knee to be performed for the next 2-3 weeks unless told otherwise by our sports medicine clinic Hyperlipidemia: Controlled continue Lipitor recheck of cholesterol in the spring 2016 Rectal dysfunction: Controlled continue as needed Cialis  Return in about 6 months (around 09/26/2014).

## 2014-03-31 ENCOUNTER — Encounter: Payer: Self-pay | Admitting: Sports Medicine

## 2014-03-31 ENCOUNTER — Ambulatory Visit (INDEPENDENT_AMBULATORY_CARE_PROVIDER_SITE_OTHER): Payer: PRIVATE HEALTH INSURANCE | Admitting: Sports Medicine

## 2014-03-31 VITALS — BP 142/92 | HR 80 | Ht 72.0 in | Wt 217.0 lb

## 2014-03-31 DIAGNOSIS — S92302G Fracture of unspecified metatarsal bone(s), left foot, subsequent encounter for fracture with delayed healing: Secondary | ICD-10-CM

## 2014-03-31 DIAGNOSIS — M2242 Chondromalacia patellae, left knee: Secondary | ICD-10-CM | POA: Insufficient documentation

## 2014-03-31 DIAGNOSIS — M2240 Chondromalacia patellae, unspecified knee: Secondary | ICD-10-CM

## 2014-03-31 MED ORDER — DICLOFENAC SODIUM 75 MG PO TBEC: 75.0000 mg | DELAYED_RELEASE_TABLET | Freq: Two times a day (BID) | ORAL | Status: DC

## 2014-03-31 NOTE — Assessment & Plan Note (Signed)
Formal PT. Diclofenac. Return in a month. He also had right prepatellar bursitis and a right suprapatellar what seemed to be a cyst. We can check this at a future visit.

## 2014-03-31 NOTE — Assessment & Plan Note (Signed)
Clinically resolved.  

## 2014-03-31 NOTE — Progress Notes (Signed)
  Subjective:    CC: Follow up  HPI: Second, third, fourth metatarsal fractures: Doing extremely well now 12 weeks post fracture, essentially pain-free.  Left knee pain: Mild, localized under the patella, worse going up and down stairs and squatting.  Past medical history, Surgical history, Family history not pertinant except as noted below, Social history, Allergies, and medications have been entered into the medical record, reviewed, and no changes needed.   Review of Systems: No fevers, chills, night sweats, weight loss, chest pain, or shortness of breath.   Objective:    General: Well Developed, well nourished, and in no acute distress.  Neuro: Alert and oriented x3, extra-ocular muscles intact, sensation grossly intact.  HEENT: Normocephalic, atraumatic, pupils equal round reactive to light, neck supple, no masses, no lymphadenopathy, thyroid nonpalpable.  Skin: Warm and dry, no rashes. Cardiac: Regular rate and rhythm, no murmurs rubs or gallops, no lower extremity edema.  Respiratory: Clear to auscultation bilaterally. Not using accessory muscles, speaking in full sentences. Left Knee: Normal to inspection with no erythema or effusion or obvious bony abnormalities. Palpation normal with no warmth or joint line tenderness or patellar tenderness or condyle tenderness. ROM normal in flexion and extension and lower leg rotation. Ligaments with solid consistent endpoints including ACL, PCL, LCL, MCL. Negative Mcmurray's and provocative meniscal tests. Painful patellar compression with crepitus. Patellar and quadriceps tendons unremarkable. Hamstring and quadriceps strength is normal. Left Foot: No visible erythema or swelling. Range of motion is full in all directions. Strength is 5/5 in all directions. No hallux valgus. No pes cavus or pes planus. No abnormal callus noted. No pain over the navicular prominence, or base of fifth metatarsal. No tenderness to palpation of the  calcaneal insertion of plantar fascia. No pain at the Achilles insertion. No pain over the calcaneal bursa. No pain of the retrocalcaneal bursa. No tenderness to palpation over the tarsals, metatarsals, or phalanges. No hallux rigidus or limitus. No tenderness palpation over interphalangeal joints. No pain with compression of the metatarsal heads. Neurovascularly intact distally.  Impression and Recommendations:

## 2014-04-09 ENCOUNTER — Ambulatory Visit: Payer: PRIVATE HEALTH INSURANCE | Admitting: Physical Therapy

## 2014-04-15 ENCOUNTER — Ambulatory Visit (INDEPENDENT_AMBULATORY_CARE_PROVIDER_SITE_OTHER): Payer: PRIVATE HEALTH INSURANCE | Admitting: Physical Therapy

## 2014-04-15 DIAGNOSIS — M2240 Chondromalacia patellae, unspecified knee: Secondary | ICD-10-CM

## 2014-04-15 DIAGNOSIS — M25676 Stiffness of unspecified foot, not elsewhere classified: Secondary | ICD-10-CM

## 2014-04-15 DIAGNOSIS — M25669 Stiffness of unspecified knee, not elsewhere classified: Secondary | ICD-10-CM

## 2014-04-15 DIAGNOSIS — M25673 Stiffness of unspecified ankle, not elsewhere classified: Secondary | ICD-10-CM

## 2014-04-15 DIAGNOSIS — M6281 Muscle weakness (generalized): Secondary | ICD-10-CM

## 2014-04-22 ENCOUNTER — Encounter (INDEPENDENT_AMBULATORY_CARE_PROVIDER_SITE_OTHER): Payer: PRIVATE HEALTH INSURANCE

## 2014-04-22 DIAGNOSIS — M25672 Stiffness of left ankle, not elsewhere classified: Secondary | ICD-10-CM

## 2014-04-22 DIAGNOSIS — M6281 Muscle weakness (generalized): Secondary | ICD-10-CM

## 2014-04-22 DIAGNOSIS — M2242 Chondromalacia patellae, left knee: Secondary | ICD-10-CM

## 2014-04-22 DIAGNOSIS — M25562 Pain in left knee: Secondary | ICD-10-CM

## 2014-04-29 ENCOUNTER — Encounter (INDEPENDENT_AMBULATORY_CARE_PROVIDER_SITE_OTHER): Payer: PRIVATE HEALTH INSURANCE | Admitting: Physical Therapy

## 2014-04-29 DIAGNOSIS — M25672 Stiffness of left ankle, not elsewhere classified: Secondary | ICD-10-CM

## 2014-04-29 DIAGNOSIS — M6281 Muscle weakness (generalized): Secondary | ICD-10-CM

## 2014-04-29 DIAGNOSIS — M2242 Chondromalacia patellae, left knee: Secondary | ICD-10-CM

## 2014-04-29 DIAGNOSIS — M25562 Pain in left knee: Secondary | ICD-10-CM

## 2014-05-09 ENCOUNTER — Ambulatory Visit: Payer: PRIVATE HEALTH INSURANCE | Admitting: Family Medicine

## 2014-05-13 ENCOUNTER — Emergency Department (INDEPENDENT_AMBULATORY_CARE_PROVIDER_SITE_OTHER)
Admission: EM | Admit: 2014-05-13 | Discharge: 2014-05-13 | Disposition: A | Discharge status: Home / Self Care | Payer: PRIVATE HEALTH INSURANCE | Source: Home / Self Care | Attending: Physician Assistant | Admitting: Physician Assistant

## 2014-05-13 ENCOUNTER — Encounter: Payer: Self-pay | Admitting: Emergency Medicine

## 2014-05-13 DIAGNOSIS — M533 Sacrococcygeal disorders, not elsewhere classified: Secondary | ICD-10-CM

## 2014-05-13 DIAGNOSIS — M545 Low back pain, unspecified: Secondary | ICD-10-CM

## 2014-05-13 MED ORDER — CYCLOBENZAPRINE HCL 10 MG PO TABS: ORAL_TABLET | ORAL | Status: DC

## 2014-05-13 MED ORDER — TRAMADOL HCL 50 MG PO TABS: ORAL_TABLET | ORAL | Status: DC

## 2014-05-13 MED ORDER — KETOROLAC TROMETHAMINE 60 MG/2ML IM SOLN
60.0000 mg | Freq: Once | INTRAMUSCULAR | Status: AC
  Administered 2014-05-13: 60 mg via INTRAMUSCULAR

## 2014-05-13 MED ORDER — MELOXICAM 15 MG PO TABS: ORAL_TABLET | ORAL | Status: DC

## 2014-05-13 NOTE — Discharge Instructions (Signed)
Tramadol for breakthrough pain.  mobic daily for next 2 weeks.  Flexeril as needed.  Ice for first few days then transition to heat.  Exercises given.

## 2014-05-13 NOTE — ED Provider Notes (Signed)
CSN: [id number]     Arrival date & time 05/13/14  1113 History   First MD Initiated Contact with Patient 05/13/14 1116     Chief Complaint  Patient presents with  . Back Pain   (Consider location/radiation/quality/duration/timing/severity/associated sxs/prior Treatment) HPI Pt is a 43 yo male who presents to urgent care with Left low back pain for 6 days after beach for a week and lifting some boxes at home. He was taking aleve, heating pad and rest and was getting better. Drove a lot yesterday and seemed to be much worse. It was very uncomfortable sleeping last night due to pain. Rates pain today 8/10 constant and dull with occasional shooting pain down left leg into buttocks. Aleve has not helped. Worse with movement more flexion at waist than extentsion. No bowel or bladder dysfunction or saddle anthesthia. No known trauma. Never really had any pain quite like this before.   Past Medical History  Diagnosis Date  . Hypertension   . Hyperlipidemia   . Erectile dysfunction   . GERD (gastroesophageal reflux disease)    History reviewed. No pertinent past surgical history. Family History  Problem Relation Age of Onset  . Heart disease Mother   . Hypertension Father   . Heart disease Maternal Grandfather   . Heart disease Paternal Grandfather    History  Substance Use Topics  . Smoking status: Former Research scientist (life sciences)  . Smokeless tobacco: Current User    Types: Chew  . Alcohol Use: 3.6 oz/week    6 Cans of beer per week    Review of Systems  All other systems reviewed and are negative.   Allergies  Review of patient's allergies indicates no known allergies.  Home Medications   Prior to Admission medications   Medication Sig Start Date End Date Taking? Authorizing Provider  atorvastatin (LIPITOR) 20 MG tablet Take 1 tablet (20 mg total) by mouth daily. 03/28/14 08/21/15  Marcial Pacas, DO  cyclobenzaprine (FLEXERIL) 10 MG tablet One half tab PO qHS, then increase gradually to one tab  TID. 05/13/14   Donella Stade, PA-C  diclofenac (VOLTAREN) 75 MG EC tablet Take 1 tablet (75 mg total) by mouth 2 (two) times daily. 03/31/14   Silverio Decamp, MD  lansoprazole (PREVACID) 30 MG capsule Take 30 mg by mouth daily.      Historical Provider, MD  losartan-hydrochlorothiazide (HYZAAR) 100-25 MG per tablet Take 1 tablet by mouth daily. 03/28/14 03/28/15  Marcial Pacas, DO  meloxicam (MOBIC) 15 MG tablet One tab PO qAM with breakfast for 2 weeks, then daily prn pain. 05/13/14   Jade L Breeback, PA-C  tadalafil (CIALIS) 5 MG tablet Take 1 tablet (5 mg total) by mouth daily as needed for erectile dysfunction. 03/28/14 08/06/14  Marcial Pacas, DO  traMADol (ULTRAM) 50 MG tablet 1-2 tabs by mouth Q8 hours, maximum 6 tabs per day. 05/13/14   Jade L Breeback, PA-C   BP 149/100  Pulse 76  Temp(Src) 97.6 F (36.4 C) (Oral)  Resp 18  Ht 6' (1.829 m)  Wt 214 lb (97.07 kg)  BMI 29.02 kg/m2  SpO2 100% Physical Exam  Constitutional: He is oriented to person, place, and time. He appears well-developed and well-nourished.  HENT:  Head: Atraumatic.  Musculoskeletal:  Pain over SI joint left side also able to palpation a muscular knot over same area. No pain over lumbar spine to palpation.  Pain worse with flexion at waist.  Made a little better with extension at waist.  Pain  with side to side movements to the right worse than left.  Patellar reflexes 2+ bilaterally.  Strength 5/5 bilaterally.   Neurological: He is alert and oriented to person, place, and time.  Psychiatric: He has a normal mood and affect. His behavior is normal.    ED Course  Procedures (including critical care time) Labs Review Labs Reviewed - No data to display  Imaging Review No results found.   MDM   1. Left-sided low back pain without sciatica   2. Sacroiliac joint dysfunction of left side    No red flags symptoms discovered.  Treated with Toradol 60mg  IM in office today.  Sent home with mobic to take  daily with no other NSAIDs for next week or 2.  Flexeril given to use as needed up to three times a day. Sedation warning given.  Encouraged ice for first couple of days then transition to heat.  Exercises given to start today.  Tramadol given for as needed breakthrough pain.  If not improving or and red flag symptoms. Please call office or schedule follow.  Decided against imaging today.     Donella Stade, PA-C 05/13/14 1259

## 2014-05-13 NOTE — ED Notes (Signed)
Pt c/o LBP x 6 days after lifting boxes at home. It improved after a couple of days and is worse again x 2 days. He took 2 Aleve this morning.

## 2014-06-11 ENCOUNTER — Ambulatory Visit (INDEPENDENT_AMBULATORY_CARE_PROVIDER_SITE_OTHER): Payer: PRIVATE HEALTH INSURANCE | Admitting: Family Medicine

## 2014-06-11 ENCOUNTER — Encounter: Payer: Self-pay | Admitting: Family Medicine

## 2014-06-11 VITALS — BP 146/96 | HR 85 | Wt 214.2 lb

## 2014-06-11 DIAGNOSIS — R059 Cough, unspecified: Secondary | ICD-10-CM

## 2014-06-11 DIAGNOSIS — J029 Acute pharyngitis, unspecified: Secondary | ICD-10-CM

## 2014-06-11 DIAGNOSIS — R05 Cough: Secondary | ICD-10-CM

## 2014-06-11 LAB — POCT RAPID STREP A (OFFICE): Rapid Strep A Screen: NEGATIVE

## 2014-06-11 MED ORDER — HYDROCODONE-HOMATROPINE 5-1.5 MG/5ML PO SYRP: 5.0000 mL | ORAL_SOLUTION | Freq: Three times a day (TID) | ORAL | Status: DC | PRN

## 2014-06-11 MED ORDER — PREDNISONE 20 MG PO TABS
ORAL_TABLET | ORAL | Status: AC
Start: 2014-06-11 — End: 2014-06-16

## 2014-06-15 NOTE — Progress Notes (Signed)
CC: Adrian Mccarty is a 43 y.o. male is here for Sore Throat and Cough   Subjective: HPI:  Complains of nonproductive cough over the past 4-5 days along with a sore throat at least for 1 week now. Improves with cough drops. No other interventions as of yet symptoms seem to be worsening on a daily basis now moderate in severity. Questionable subjective fevers with chills. Denies night sweats, shortness of breath, wheezing, chest pain, confusion, nor blood in sputum.   Review Of Systems Outlined In HPI  Past Medical History  Diagnosis Date  . Hypertension   . Hyperlipidemia   . Erectile dysfunction   . GERD (gastroesophageal reflux disease)     No past surgical history on file. Family History  Problem Relation Age of Onset  . Heart disease Mother   . Hypertension Father   . Heart disease Maternal Grandfather   . Heart disease Paternal Grandfather     History   Social History  . Marital Status: Married    Spouse Name: N/A    Number of Children: N/A  . Years of Education: N/A   Occupational History  . Not on file.   Social History Main Topics  . Smoking status: Former Research scientist (life sciences)  . Smokeless tobacco: Current User    Types: Chew  . Alcohol Use: 3.6 oz/week    6 Cans of beer per week  . Drug Use: Not on file  . Sexual Activity: Yes    Birth Control/ Protection: Surgical   Other Topics Concern  . Not on file   Social History Narrative     Objective: BP 146/96 mmHg  Pulse 85  Wt 214 lb 4 oz (97.183 kg)  General: Alert and Oriented, No Acute Distress HEENT: Pupils equal, round, reactive to light. Conjunctivae clear.  External ears unremarkable, canals clear with intact TMs with appropriate landmarks.  Middle ear appears open without effusion. Boggy erythematous inferior turbinates, moderate mucoid discharge.  Moist mucous membranes, pharynx with moderate inflammation and cobblestoning but no other lesions..  Neck supple without palpable lymphadenopathy nor abnormal  masses. Lungs: Clear to auscultation bilaterally, no wheezing/ronchi/rales.  Comfortable work of breathing. Good air movement. Cardiac: Regular rate and rhythm. Normal S1/S2.  No murmurs, rubs, nor gallops.   Skin: Warm and dry.  Assessment & Plan: Adrian Mccarty was seen today for sore throat and cough.  Diagnoses and associated orders for this visit:  Acute pharyngitis, unspecified pharyngitis type - POCT rapid strep A  Cough - predniSONE (DELTASONE) 20 MG tablet; Three tabs at once daily for five days. - HYDROcodone-homatropine (HYCODAN) 5-1.5 MG/5ML syrup; Take 5 mLs by mouth every 8 (eight) hours as needed for cough.    Cough: Reassurance provided this is most likely viral, no evidence of bacterial infection. Begin moderate dose of prednisone to help with persistent pharyngitis and Hycodan as needed for cough  I've asked him to call me on Monday if no better and I will then call in doxycycline   Return if symptoms worsen or fail to improve.

## 2014-07-15 ENCOUNTER — Telehealth: Payer: Self-pay | Admitting: *Deleted

## 2014-07-15 DIAGNOSIS — I10 Essential (primary) hypertension: Secondary | ICD-10-CM

## 2014-07-15 DIAGNOSIS — E785 Hyperlipidemia, unspecified: Secondary | ICD-10-CM

## 2014-07-15 MED ORDER — ATORVASTATIN CALCIUM 20 MG PO TABS: 20.0000 mg | ORAL_TABLET | Freq: Every day | ORAL | Status: DC

## 2014-07-15 MED ORDER — LOSARTAN POTASSIUM-HCTZ 100-25 MG PO TABS: 1.0000 | ORAL_TABLET | Freq: Every day | ORAL | Status: DC

## 2014-07-15 NOTE — Telephone Encounter (Signed)
Pt is switching pharm apparently and wants lipitor and losartan sent to [facility] [address]

## 2014-10-14 ENCOUNTER — Other Ambulatory Visit: Payer: Self-pay | Admitting: Family Medicine

## 2014-10-27 ENCOUNTER — Other Ambulatory Visit: Payer: Self-pay | Admitting: Family Medicine

## 2014-10-27 DIAGNOSIS — N529 Male erectile dysfunction, unspecified: Secondary | ICD-10-CM

## 2014-10-27 MED ORDER — TADALAFIL 5 MG PO TABS: 5.0000 mg | ORAL_TABLET | Freq: Every day | ORAL | Status: DC | PRN

## 2014-11-25 ENCOUNTER — Encounter: Payer: PRIVATE HEALTH INSURANCE | Admitting: Family Medicine

## 2015-01-28 ENCOUNTER — Encounter: Payer: PRIVATE HEALTH INSURANCE | Admitting: Family Medicine

## 2015-02-02 ENCOUNTER — Encounter: Payer: PRIVATE HEALTH INSURANCE | Admitting: Family Medicine

## 2015-03-31 ENCOUNTER — Encounter: Payer: Self-pay | Admitting: Family Medicine

## 2015-03-31 ENCOUNTER — Ambulatory Visit (INDEPENDENT_AMBULATORY_CARE_PROVIDER_SITE_OTHER): Payer: Managed Care, Other (non HMO) | Admitting: Family Medicine

## 2015-03-31 VITALS — BP 180/113 | HR 92 | Ht 72.0 in | Wt 225.0 lb

## 2015-03-31 DIAGNOSIS — Z0000 Encounter for general adult medical examination without abnormal findings: Secondary | ICD-10-CM | POA: Diagnosis not present

## 2015-03-31 DIAGNOSIS — I10 Essential (primary) hypertension: Secondary | ICD-10-CM | POA: Diagnosis not present

## 2015-03-31 LAB — LIPID PANEL
Cholesterol: 268 mg/dL — ABNORMAL HIGH (ref 125–200)
HDL: 53 mg/dL (ref >=40)
LDL CALC: 181 mg/dL — AB (ref <130)
Total CHOL/HDL Ratio: 5.1 Ratio — ABNORMAL HIGH (ref <=5.0)
Triglycerides: 168 mg/dL — ABNORMAL HIGH (ref <150)
VLDL: 34 mg/dL — AB (ref <30)

## 2015-03-31 LAB — COMPLETE METABOLIC PANEL WITH GFR
ALT: 27 U/L (ref 9–46)
AST: 23 U/L (ref 10–40)
Albumin: 4.8 g/dL (ref 3.6–5.1)
Alkaline Phosphatase: 56 U/L (ref 40–115)
BUN: 15 mg/dL (ref 7–25)
CHLORIDE: 98 mmol/L (ref 98–110)
CO2: 29 mmol/L (ref 20–31)
CREATININE: 0.9 mg/dL (ref 0.60–1.35)
Calcium: 9.8 mg/dL (ref 8.6–10.3)
GFR, Est African American: >89 mL/min (ref >=60)
GFR, Est Non African American: >89 mL/min (ref >=60)
Glucose, Bld: 103 mg/dL — ABNORMAL HIGH (ref 65–99)
Potassium: 4.7 mmol/L (ref 3.5–5.3)
SODIUM: 139 mmol/L (ref 135–146)
Total Bilirubin: 0.6 mg/dL (ref 0.2–1.2)
Total Protein: 7.6 g/dL (ref 6.1–8.1)

## 2015-03-31 LAB — CBC
HCT: 45.2 % (ref 39.0–52.0)
Hemoglobin: 15.5 g/dL (ref 13.0–17.0)
MCH: 30.5 pg (ref 26.0–34.0)
MCHC: 34.3 g/dL (ref 30.0–36.0)
MCV: 89 fL (ref 78.0–100.0)
MPV: 8.8 fL (ref 8.6–12.4)
PLATELETS: 265 10*3/uL (ref 150–400)
RBC: 5.08 MIL/uL (ref 4.22–5.81)
RDW: 12.8 % (ref 11.5–15.5)
WBC: 5.5 10*3/uL (ref 4.0–10.5)

## 2015-03-31 MED ORDER — LISINOPRIL-HYDROCHLOROTHIAZIDE 20-25 MG PO TABS: 1.0000 | ORAL_TABLET | Freq: Every day | ORAL | Status: DC

## 2015-03-31 NOTE — Progress Notes (Signed)
CC: Adrian Mccarty is a 44 y.o. male is here for Annual Exam   Subjective: HPI:   no family history  Of colon cancer or prostate cancer will consider screening at age 61.   Influenza Vaccine: declined today Pneumovax: no current indication Td/Tdap: UTD Zoster: (Start 45 yo)  Presents for complete physical exam with his only complaint being erectile dysfunction that was present only when taking losartan- HCTZ. When he ran out of this medication 2 months ago symptoms resolved 100%.  Review of Systems - General ROS: negative for - chills, fever, night sweats, weight gain or weight loss Ophthalmic ROS: negative for - decreased vision Psychological ROS: negative for - anxiety or depression ENT ROS: negative for - hearing change, nasal congestion, tinnitus or allergies Hematological and Lymphatic ROS: negative for - bleeding problems, bruising or swollen lymph nodes Breast ROS: negative Respiratory ROS: no cough, shortness of breath, or wheezing Cardiovascular ROS: no chest pain or dyspnea on exertion Gastrointestinal ROS: no abdominal pain, change in bowel habits, or black or bloody stools Genito-Urinary ROS: negative for - genital discharge, genital ulcers, incontinence or abnormal bleeding from genitals Musculoskeletal ROS: negative for - joint pain or muscle pain Neurological ROS: negative for - headaches or memory loss Dermatological ROS: negative for lumps, mole changes, rash and skin lesion changes  Past Medical History  Diagnosis Date  . Hypertension   . Hyperlipidemia   . Erectile dysfunction   . GERD (gastroesophageal reflux disease)     No past surgical history on file. Family History  Problem Relation Age of Onset  . Heart disease Mother   . Hypertension Father   . Heart disease Maternal Grandfather   . Heart disease Paternal Grandfather     Social History   Social History  . Marital Status: Married    Spouse Name: N/A  . Number of Children: N/A  . Years of  Education: N/A   Occupational History  . Not on file.   Social History Main Topics  . Smoking status: Former Research scientist (life sciences)  . Smokeless tobacco: Current User    Types: Chew  . Alcohol Use: 3.6 oz/week    6 Cans of beer per week  . Drug Use: Not on file  . Sexual Activity: Yes    Birth Control/ Protection: Surgical   Other Topics Concern  . Not on file   Social History Narrative     Objective: BP 180/113 mmHg  Pulse 92  Ht 6' (1.829 m)  Wt 225 lb (102.059 kg)  BMI 30.51 kg/m2  General: No Acute Distress HEENT: Atraumatic, normocephalic, conjunctivae normal without scleral icterus.  No nasal discharge, hearing grossly intact, TMs with good landmarks bilaterally with no middle ear abnormalities, posterior pharynx clear without oral lesions. Neck: Supple, trachea midline, no cervical nor supraclavicular adenopathy. Pulmonary: Clear to auscultation bilaterally without wheezing, rhonchi, nor rales. Cardiac: Regular rate and rhythm.  No murmurs, rubs, nor gallops. No peripheral edema.  2+ peripheral pulses bilaterally. Abdomen: Bowel sounds normal.  No masses.  Non-tender without rebound.  Negative Murphy's sign. MSK: Grossly intact, no signs of weakness.  Full strength throughout upper and lower extremities.  Full ROM in upper and lower extremities.  No midline spinal tenderness. Neuro: Gait unremarkable, CN II-XII grossly intact.  C5-C6 Reflex 2/4 Bilaterally, L4 Reflex 2/4 Bilaterally.  Cerebellar function intact. Skin: No rashes. Psych: Alert and oriented to person/place/time.  Thought process normal. No anxiety/depression.  Assessment & Plan: [name] was seen today for annual exam.  Diagnoses  and all orders for this visit:  Annual physical exam -     Lipid panel -     CBC -     COMPLETE METABOLIC PANEL WITH GFR -     Discontinue: lisinopril-hydrochlorothiazide (PRINZIDE,ZESTORETIC) 20-25 MG per tablet; Take 1 tablet by mouth daily. -     lisinopril-hydrochlorothiazide  (PRINZIDE,ZESTORETIC) 20-25 MG per tablet; Take 1 tablet by mouth daily.  Essential hypertension   Healthy lifestyle interventions including but not limited to regular exercise, a healthy low fat diet, moderation of salt intake, the dangers of tobacco/alcohol/recreational drug use, nutrition supplementation, and accident avoidance were discussed with the patient and a handout was provided for future reference. Encouraged to take blood pressure on a daily basis after starting new lisinopril-HCTZ and drop off in one week.  Return in about 4 weeks (around 04/28/2015) for Blood pressure.

## 2015-04-01 ENCOUNTER — Telehealth: Payer: Self-pay | Admitting: Family Medicine

## 2015-04-01 DIAGNOSIS — R739 Hyperglycemia, unspecified: Secondary | ICD-10-CM

## 2015-04-01 MED ORDER — ATORVASTATIN CALCIUM 20 MG PO TABS: ORAL_TABLET | ORAL | Status: DC

## 2015-04-01 NOTE — Telephone Encounter (Signed)
Seth Bake, Will you please let patient know that his LDL cholesterol was significantly elevated at 181 with a goal of less than 100.  I would recommend that he restart a daily dose of atorvastatin and [facility] [address].  His blood sugar was mildly elevated and I'd recommend he have a 3 month average blood sugar test, either added on or a lab only visit (order in your in box).  Everything else looked normal.

## 2015-04-01 NOTE — Telephone Encounter (Signed)
Pt.notified

## 2015-04-06 ENCOUNTER — Encounter: Payer: Self-pay | Admitting: Family Medicine

## 2015-04-06 DIAGNOSIS — Z0000 Encounter for general adult medical examination without abnormal findings: Secondary | ICD-10-CM

## 2015-04-06 MED ORDER — LISINOPRIL-HYDROCHLOROTHIAZIDE 20-25 MG PO TABS: 0.5000 | ORAL_TABLET | Freq: Every day | ORAL | Status: DC

## 2015-04-06 NOTE — Addendum Note (Signed)
Addended by: Marcial Pacas on: 04/06/2015 12:52 PM   Modules accepted: Orders

## 2015-05-13 ENCOUNTER — Encounter: Payer: Self-pay | Admitting: Family Medicine

## 2015-05-13 ENCOUNTER — Ambulatory Visit (INDEPENDENT_AMBULATORY_CARE_PROVIDER_SITE_OTHER): Payer: Managed Care, Other (non HMO) | Admitting: Family Medicine

## 2015-05-13 VITALS — BP 160/102 | HR 84 | Wt 222.0 lb

## 2015-05-13 DIAGNOSIS — Z0000 Encounter for general adult medical examination without abnormal findings: Secondary | ICD-10-CM | POA: Diagnosis not present

## 2015-05-13 DIAGNOSIS — F458 Other somatoform disorders: Secondary | ICD-10-CM | POA: Diagnosis not present

## 2015-05-13 DIAGNOSIS — IMO0001 Reserved for inherently not codable concepts without codable children: Secondary | ICD-10-CM

## 2015-05-13 DIAGNOSIS — R030 Elevated blood-pressure reading, without diagnosis of hypertension: Secondary | ICD-10-CM

## 2015-05-13 MED ORDER — DIAZEPAM 5 MG PO TABS: ORAL_TABLET | ORAL | Status: DC

## 2015-05-13 MED ORDER — TRAMADOL HCL 50 MG PO TABS: 50.0000 mg | ORAL_TABLET | Freq: Three times a day (TID) | ORAL | Status: DC | PRN

## 2015-05-13 MED ORDER — LISINOPRIL-HYDROCHLOROTHIAZIDE 20-25 MG PO TABS: 1.0000 | ORAL_TABLET | Freq: Every day | ORAL | Status: DC

## 2015-05-13 NOTE — Progress Notes (Signed)
CC: Adrian Mccarty is a 44 y.o. male is here for Neck and Facal Pain   Subjective: HPI:  Facial pain localized in the jaw muscles that radiates down the back of the neck. It's been present for a matter of months but not getting any better. He's tried ibuprofen, multiple different mouth guards but nothing seems to make better or worse. He knows that he grinds his teeth when he sleeps. The pain actually feels better if he takes Flexeril however caused him to fall asleep within a few minutes. He's been taking and also right before he goes to bed and he is not certain if it seems to help him upon wakening. He denies any nasal congestion, sore throat, or sinus pain. He denies any rigidity or spasticity.  He again confirms that he is taking a full dose of lisinopril-hydrochlorothiazide and blood pressures at home are almost always 120/80 and never above 140/90 he's been checking this also at a local gym and these numbers are confirmed at the gym as well   Review Of Systems Outlined In HPI  Past Medical History  Diagnosis Date  . Hypertension   . Hyperlipidemia   . Erectile dysfunction   . GERD (gastroesophageal reflux disease)     No past surgical history on file. Family History  Problem Relation Age of Onset  . Heart disease Mother   . Hypertension Father   . Heart disease Maternal Grandfather   . Heart disease Paternal Grandfather     Social History   Social History  . Marital Status: Married    Spouse Name: N/A  . Number of Children: N/A  . Years of Education: N/A   Occupational History  . Not on file.   Social History Main Topics  . Smoking status: Former Research scientist (life sciences)  . Smokeless tobacco: Current User    Types: Chew  . Alcohol Use: 3.6 oz/week    6 Cans of beer per week  . Drug Use: Not on file  . Sexual Activity: Yes    Birth Control/ Protection: Surgical   Other Topics Concern  . Not on file   Social History Narrative     Objective: BP 160/102 mmHg  Pulse 84  Wt 222  lb (100.699 kg)  General: Alert and Oriented, No Acute Distress HEENT: Pupils equal, round, reactive to light. Conjunctivae clear.  Moist membranes pharynx unremarkable. No palpable neck masses. No TMJ clicking or malalignment. Lungs: Clear comfortable work of breathing Cardiac: Regular rate and rhythm.  Extremities: No peripheral edema.  Strong peripheral pulses.  Mental Status: No depression, anxiety, nor agitation. Skin: Warm and dry.  Assessment & Plan: Adrian Mccarty was seen today for neck and facal pain.  Diagnoses and all orders for this visit:  Annual physical exam -     lisinopril-hydrochlorothiazide (PRINZIDE,ZESTORETIC) 20-25 MG tablet; Take 1 tablet by mouth daily.  Bruxism  White coat hypertension  Other orders -     diazepam (VALIUM) 5 MG tablet; One by mouth every night at bedtime for two weeks then half a tab at bed time for one week. -     traMADol (ULTRAM) 50 MG tablet; Take 1 tablet (50 mg total) by mouth every 8 (eight) hours as needed for moderate pain.   Please discontinue diagnosis of annual physical exam above   Whitecoat hypertension controlled continue lisinopril -HCTZ Bruxism: Continue mouthguard, start low dose of Valium every night tapering down after 2 weeks. If this does not help next option will be doxepin  Return  if symptoms worsen or fail to improve.

## 2015-05-14 ENCOUNTER — Encounter: Payer: Self-pay | Admitting: Family Medicine

## 2015-05-18 ENCOUNTER — Telehealth: Payer: Self-pay | Admitting: Family Medicine

## 2015-05-18 MED ORDER — HYDROCODONE-ACETAMINOPHEN 5-325 MG PO TABS: 1.0000 | ORAL_TABLET | Freq: Three times a day (TID) | ORAL | Status: DC | PRN

## 2015-05-18 NOTE — Telephone Encounter (Signed)
[place], Rx placed in in-box ready for pickup/faxing. Tramadol was causing nausea.

## 2015-05-18 NOTE — Telephone Encounter (Signed)
Pt advised.

## 2015-06-29 ENCOUNTER — Other Ambulatory Visit: Payer: Self-pay | Admitting: Family Medicine

## 2015-06-29 ENCOUNTER — Encounter: Payer: Self-pay | Admitting: Family Medicine

## 2015-06-29 MED ORDER — HYDROCODONE-ACETAMINOPHEN 5-325 MG PO TABS: 1.0000 | ORAL_TABLET | Freq: Three times a day (TID) | ORAL | Status: DC | PRN

## 2015-08-10 ENCOUNTER — Encounter: Payer: Self-pay | Admitting: Family Medicine

## 2015-08-11 MED ORDER — SILDENAFIL CITRATE 20 MG PO TABS: ORAL_TABLET | ORAL | Status: DC

## 2015-09-15 ENCOUNTER — Encounter: Payer: Self-pay | Admitting: Family Medicine

## 2015-09-15 ENCOUNTER — Other Ambulatory Visit: Payer: Self-pay | Admitting: Family Medicine

## 2015-09-15 MED ORDER — DIAZEPAM 5 MG PO TABS: ORAL_TABLET | ORAL | Status: DC

## 2015-09-15 MED ORDER — HYDROCODONE-ACETAMINOPHEN 5-325 MG PO TABS: 1.0000 | ORAL_TABLET | Freq: Three times a day (TID) | ORAL | Status: DC | PRN

## 2015-09-15 NOTE — Telephone Encounter (Signed)
[place], Rx placed in in-box ready for pickup/faxing.  

## 2015-10-04 ENCOUNTER — Encounter: Payer: Self-pay | Admitting: Emergency Medicine

## 2015-10-04 ENCOUNTER — Emergency Department
Admission: EM | Admit: 2015-10-04 | Discharge: 2015-10-04 | Disposition: A | Discharge status: Home / Self Care | Payer: Managed Care, Other (non HMO) | Source: Home / Self Care | Attending: Family Medicine | Admitting: Family Medicine

## 2015-10-04 DIAGNOSIS — J029 Acute pharyngitis, unspecified: Secondary | ICD-10-CM | POA: Diagnosis not present

## 2015-10-04 LAB — POCT RAPID STREP A (OFFICE): Rapid Strep A Screen: NEGATIVE

## 2015-10-04 MED ORDER — BENZONATATE 200 MG PO CAPS: 200.0000 mg | ORAL_CAPSULE | Freq: Every day | ORAL | Status: DC

## 2015-10-04 MED ORDER — PENICILLIN V POTASSIUM 500 MG PO TABS: ORAL_TABLET | ORAL | Status: DC

## 2015-10-04 NOTE — ED Provider Notes (Signed)
CSN: [id number]     Arrival date & time 10/04/15  1519 History   First MD Initiated Contact with Patient 10/04/15 1745     Chief Complaint  Patient presents with  . Sore Throat      HPI Comments: Patient complains of two day history of typical cold-like symptoms including sore throat, sinus congestion, headache, fatigue, and cough.  He is concerned that he may have a strep throat because his wife has been diagnosed with strep.  The history is provided by the patient.    Past Medical History  Diagnosis Date  . Hypertension   . Hyperlipidemia   . Erectile dysfunction   . GERD (gastroesophageal reflux disease)    History reviewed. No pertinent past surgical history. Family History  Problem Relation Age of Onset  . Heart disease Mother   . Hypertension Father   . Heart disease Maternal Grandfather   . Heart disease Paternal Grandfather    Social History  Substance Use Topics  . Smoking status: Former Research scientist (life sciences)  . Smokeless tobacco: Current User    Types: Chew  . Alcohol Use: 3.6 oz/week    6 Cans of beer per week    Review of Systems + sore throat + cough + sneezing No pleuritic pain No wheezing + nasal congestion + post-nasal drainage No sinus pain/pressure No itchy/red eyes No earache No hemoptysis No SOB No fever, + chills/sweats No nausea No vomiting No abdominal pain No diarrhea No urinary symptoms No skin rash + fatigue + myalgias + headache Used OTC meds without relief  Allergies  Losartan potassium-hctz and Tramadol  Home Medications   Prior to Admission medications   Medication Sig Start Date End Date Taking? Authorizing Provider  benzonatate (TESSALON) 200 MG capsule Take 1 capsule (200 mg total) by mouth at bedtime. Take as needed for cough 10/04/15   Kandra Nicolas, MD  lisinopril-hydrochlorothiazide (PRINZIDE,ZESTORETIC) 20-25 MG tablet Take 1 tablet by mouth daily. 05/13/15   Marcial Pacas, DO  penicillin v potassium (VEETID) 500 MG tablet  Take one tab by mouth twice daily for 10 days 10/04/15   Kandra Nicolas, MD   Meds Ordered and Administered this Visit  Medications - No data to display  BP 126/83 mmHg  Pulse 87  Temp(Src) 98.2 F (36.8 C) (Oral)  Ht 6' (1.829 m)  Wt 224 lb (101.606 kg)  BMI 30.37 kg/m2  SpO2 97% No data found.   Physical Exam Nursing notes and Vital Signs reviewed. Appearance:  Patient appears stated age, and in no acute distress Eyes:  Pupils are equal, round, and reactive to light and accomodation.  Extraocular movement is intact.  Conjunctivae are not inflamed  Ears:  Canals normal.  Tympanic membranes normal.  Nose:  Mildly congested turbinates.  No sinus tenderness.   Pharynx:  Uvula slightly edentulous, otherwise normal Neck:  Supple.  Tonsillar nodes are mildly tender.  Tender enlarged posterior/lateral nodes are palpated bilaterally  Lungs:  Clear to auscultation.  Breath sounds are equal.  Moving air well. Heart:  Regular rate and rhythm without murmurs, rubs, or gallops.  Abdomen:  Nontender without masses or hepatosplenomegaly.  Bowel sounds are present.  No CVA or flank tenderness.  Extremities:  No edema.  Skin:  No rash present.   ED Course  Procedures none    Labs Reviewed  STREP A DNA PROBE  POCT RAPID STREP A (OFFICE) negative     MDM   1. Acute pharyngitis, unspecified etiology; suspect early viral  URI    Throat culture pending. Begin empiric PenVK for strep coverage. Prescription written for Benzonatate [facility]) to take at bedtime for night-time cough.  If cold-like symptoms develop and increase, try the following: Take plain guaifenesin (1200mg  extended release tabs such as Mucinex) twice daily, with plenty of water, for cough and congestion. Get adequate rest.   May use Afrin nasal spray (or generic oxymetazoline) twice daily for about 5 days and then discontinue.  Also recommend using saline nasal spray several times daily and saline nasal irrigation (AYR is a  common brand).  Use Flonase nasal spray each morning after using Afrin nasal spray and saline nasal irrigation. Try warm salt water gargles for sore throat.  Stop all antihistamines for now, and other non-prescription cough/cold preparations. May take Ibuprofen 200mg , 4 tabs every 8 hours with food for sore throat, headache, etc.   Follow-up with family doctor if not improving about10 days.     Kandra Nicolas, MD 10/07/15 [phone]

## 2015-10-04 NOTE — ED Notes (Signed)
Pt c/o sore throat x 2 days, sore neck, wife has strep throat

## 2015-10-04 NOTE — Discharge Instructions (Signed)
If cold-like symptoms develop and increase, try the following: Take plain guaifenesin (1200mg  extended release tabs such as Mucinex) twice daily, with plenty of water, for cough and congestion. Get adequate rest.   May use Afrin nasal spray (or generic oxymetazoline) twice daily for about 5 days and then discontinue.  Also recommend using saline nasal spray several times daily and saline nasal irrigation (AYR is a common brand).  Use Flonase nasal spray each morning after using Afrin nasal spray and saline nasal irrigation. Try warm salt water gargles for sore throat.  Stop all antihistamines for now, and other non-prescription cough/cold preparations. May take Ibuprofen 200mg , 4 tabs every 8 hours with food for sore throat, headache, etc.   Follow-up with family doctor if not improving about10 days.

## 2015-10-06 ENCOUNTER — Telehealth: Payer: Self-pay | Admitting: *Deleted

## 2015-10-06 LAB — STREP A DNA PROBE: GASP: NOT DETECTED

## 2015-11-30 ENCOUNTER — Encounter: Payer: Self-pay | Admitting: Family Medicine

## 2015-12-01 ENCOUNTER — Ambulatory Visit (INDEPENDENT_AMBULATORY_CARE_PROVIDER_SITE_OTHER): Payer: Managed Care, Other (non HMO) | Admitting: Family Medicine

## 2015-12-01 ENCOUNTER — Encounter: Payer: Self-pay | Admitting: Family Medicine

## 2015-12-01 VITALS — BP 129/83 | HR 108 | Wt 225.0 lb

## 2015-12-01 DIAGNOSIS — R6884 Jaw pain: Secondary | ICD-10-CM | POA: Diagnosis not present

## 2015-12-01 DIAGNOSIS — E7800 Pure hypercholesterolemia, unspecified: Secondary | ICD-10-CM

## 2015-12-01 DIAGNOSIS — R739 Hyperglycemia, unspecified: Secondary | ICD-10-CM | POA: Diagnosis not present

## 2015-12-01 MED ORDER — HYDROCODONE-ACETAMINOPHEN 5-325 MG PO TABS: 1.0000 | ORAL_TABLET | Freq: Three times a day (TID) | ORAL | Status: DC | PRN

## 2015-12-01 MED ORDER — DIAZEPAM 5 MG PO TABS: ORAL_TABLET | ORAL | Status: DC

## 2015-12-01 NOTE — Progress Notes (Signed)
CC: Adrian Mccarty is a 45 y.o. male is here for Jaw Pain   Subjective: HPI:  He's requesting a refill on Valium and hydrocodone. He tells me that if he uses this at breaks the stretch of jaw pain that he's experienced a few times out of the year. Right now he's been experiencing jaw pain for the past 2-3 days similar to presentations in the past most recently in February. It's described as a stiffness and pain that radiates from his cheeks up into the temples. He denies any pulsatile pain. It's worse after stressful days and his pretty sure he clenches his teeth while sleeping. He denies fevers, chills, dysphagia, ear pain or sinus pressure.  Follow hyperlipidemia: He is trying his best with diet and exercises herbs and spices to help with lowering his cholesterol. He denies any chest pain shortness of breath orthopnea nor peripheral edema.  Follow-up hyperglycemia: He believes that his made a big improvement with cutting back on carbohydrates in his diet. No polyuria polyphagia or polydipsia   Review Of Systems Outlined In HPI  Past Medical History  Diagnosis Date  . Hypertension   . Hyperlipidemia   . Erectile dysfunction   . GERD (gastroesophageal reflux disease)     No past surgical history on file. Family History  Problem Relation Age of Onset  . Heart disease Mother   . Hypertension Father   . Heart disease Maternal Grandfather   . Heart disease Paternal Grandfather     Social History   Social History  . Marital Status: Married    Spouse Name: N/A  . Number of Children: N/A  . Years of Education: N/A   Occupational History  . Not on file.   Social History Main Topics  . Smoking status: Former Research scientist (life sciences)  . Smokeless tobacco: Current User    Types: Chew  . Alcohol Use: 3.6 oz/week    6 Cans of beer per week  . Drug Use: Not on file  . Sexual Activity: Yes    Birth Control/ Protection: Surgical   Other Topics Concern  . Not on file   Social History Narrative      Objective: BP 129/83 mmHg  Pulse 108  Wt 225 lb (102.059 kg)  General: Alert and Oriented, No Acute Distress HEENT: Pupils equal, round, reactive to light. Conjunctivae clear.  External ears unremarkable, canals clear with intact TMs with appropriate landmarks.  Middle ear appears open without effusion. Pink inferior turbinates.  Moist mucous membranes, pharynx without inflammation nor lesions.  Neck supple without palpable lymphadenopathy nor abnormal masses. Lungs: Clear to auscultation bilaterally, no wheezing/ronchi/rales.  Comfortable work of breathing. Good air movement. Cardiac: Regular rate and rhythm. Normal S1/S2.  No murmurs, rubs, nor gallops.   Extremities: No peripheral edema.  Strong peripheral pulses.  Mental Status: No depression, anxiety, nor agitation. Skin: Warm and dry.  Assessment & Plan: Landun was seen today for jaw pain.  Diagnoses and all orders for this visit:  HYPERCHOLESTEROLEMIA -     Lipid panel  Hyperglycemia -     BASIC METABOLIC PANEL WITH GFR  Jaw pain  Other orders -     diazepam (VALIUM) 5 MG tablet; One by mouth at bedtime for two weeks then half a tab at bedtime for one week. -     HYDROcodone-acetaminophen (NORCO) 5-325 MG tablet; Take 1 tablet by mouth every 8 (eight) hours as needed for moderate pain.   Annie Main has been extremely responsible for thallium and hydrocodone in  the past. I see no reason why he can't have another stretch of this medication to help with his jaw clenching. He is going to consider going and getting a custom mouthguard from his dentist. Hyperlipidemia: Due for lipid panel Hyperglycemia: Clinically controlled due for metabolic panel.  Return if symptoms worsen or fail to improve.

## 2015-12-02 LAB — LIPID PANEL
Cholesterol: 284 mg/dL — ABNORMAL HIGH (ref 125–200)
HDL: 51 mg/dL (ref >=40)
LDL CALC: 204 mg/dL — AB (ref <130)
Total CHOL/HDL Ratio: 5.6 Ratio — ABNORMAL HIGH (ref <=5.0)
Triglycerides: 143 mg/dL (ref <150)
VLDL: 29 mg/dL (ref <30)

## 2015-12-02 LAB — BASIC METABOLIC PANEL WITH GFR
BUN: 16 mg/dL (ref 7–25)
CHLORIDE: 97 mmol/L — AB (ref 98–110)
CO2: 28 mmol/L (ref 20–31)
CREATININE: 1 mg/dL (ref 0.60–1.35)
Calcium: 9.7 mg/dL (ref 8.6–10.3)
GFR, Est African American: >89 mL/min (ref >=60)
GFR, Est Non African American: >89 mL/min (ref >=60)
Glucose, Bld: 93 mg/dL (ref 65–99)
Potassium: 4.2 mmol/L (ref 3.5–5.3)
SODIUM: 136 mmol/L (ref 135–146)

## 2015-12-03 ENCOUNTER — Telehealth: Payer: Self-pay | Admitting: Family Medicine

## 2015-12-03 MED ORDER — ATORVASTATIN CALCIUM 20 MG PO TABS: 20.0000 mg | ORAL_TABLET | Freq: Every day | ORAL | Status: DC

## 2015-12-03 NOTE — Telephone Encounter (Signed)
Will you please let patient know that his LDL cholesterol was signifigantly rased to a degree that I'd recommend starting on a cholesterol lowering medication called atorvastatin that I've sent to his [facility]. I'd recommend rechecking this in three months. Blood sugar and kidney function were normal.

## 2015-12-03 NOTE — Telephone Encounter (Signed)
Pt notified of recommendations

## 2016-05-21 ENCOUNTER — Ambulatory Visit (HOSPITAL_BASED_OUTPATIENT_CLINIC_OR_DEPARTMENT_OTHER)
Admit: 2016-05-21 | Discharge: 2016-05-21 | Disposition: A | Discharge status: Home / Self Care | Payer: Managed Care, Other (non HMO) | Attending: Family Medicine | Admitting: Family Medicine

## 2016-05-21 ENCOUNTER — Emergency Department
Admission: EM | Admit: 2016-05-21 | Discharge: 2016-05-21 | Disposition: A | Discharge status: Home / Self Care | Payer: Managed Care, Other (non HMO) | Source: Home / Self Care | Attending: Family Medicine | Admitting: Family Medicine

## 2016-05-21 ENCOUNTER — Encounter: Payer: Self-pay | Admitting: Emergency Medicine

## 2016-05-21 DIAGNOSIS — R1031 Right lower quadrant pain: Secondary | ICD-10-CM | POA: Insufficient documentation

## 2016-05-21 DIAGNOSIS — N50811 Right testicular pain: Secondary | ICD-10-CM | POA: Diagnosis present

## 2016-05-21 DIAGNOSIS — N451 Epididymitis: Secondary | ICD-10-CM | POA: Insufficient documentation

## 2016-05-21 DIAGNOSIS — N433 Hydrocele, unspecified: Secondary | ICD-10-CM | POA: Diagnosis not present

## 2016-05-21 LAB — POCT URINALYSIS DIP (MANUAL ENTRY)
Bilirubin, UA: NEGATIVE
Blood, UA: NEGATIVE
Glucose, UA: NEGATIVE
Ketones, POC UA: NEGATIVE
Leukocytes, UA: NEGATIVE
Nitrite, UA: NEGATIVE
Protein Ur, POC: NEGATIVE
Spec Grav, UA: 1.015 (ref 1.005–1.03)
Urobilinogen, UA: NEGATIVE (ref 0–1)
pH, UA: 6 (ref 5–8)

## 2016-05-21 MED ORDER — HYDROCODONE-ACETAMINOPHEN 5-325 MG PO TABS: 1.0000 | ORAL_TABLET | Freq: Four times a day (QID) | ORAL | 0 refills | Status: DC | PRN

## 2016-05-21 MED ORDER — KETOROLAC TROMETHAMINE 60 MG/2ML IM SOLN
60.0000 mg | Freq: Once | INTRAMUSCULAR | Status: AC
  Administered 2016-05-21: 60 mg via INTRAMUSCULAR

## 2016-05-21 MED ORDER — DOXYCYCLINE HYCLATE 100 MG PO CAPS: 100.0000 mg | ORAL_CAPSULE | Freq: Two times a day (BID) | ORAL | 0 refills | Status: DC

## 2016-05-21 MED ORDER — IBUPROFEN 600 MG PO TABS: 600.0000 mg | ORAL_TABLET | Freq: Four times a day (QID) | ORAL | 0 refills | Status: DC | PRN

## 2016-05-21 NOTE — ED Provider Notes (Signed)
CSN: [id number]     Arrival date & time 05/21/16  1352 History   First MD Initiated Contact with Patient 05/21/16 1424     Chief Complaint  Patient presents with  . Groin Pain   (Consider location/radiation/quality/duration/timing/severity/associated sxs/prior Treatment) HPI Adrian Mccarty is a 45 y.o. male presenting to UC with c/o Right groin pain that radiates into his Right testicle. Symptoms started last night while he was driving back from [place]. Pain was mild and intermittent but today, pain has become constant and more severe.  Pain is 7/10, aching, worse with sitting but better when lying flat last night.  Denies heavy lifting or falls. He notes he has had epididymitis in the past having a vasectomy, however, has not had that in several years.  Denies fever, chills, n/v/d. Denies pain with urination.    Past Medical History:  Diagnosis Date  . Erectile dysfunction   . GERD (gastroesophageal reflux disease)   . Hyperlipidemia   . Hypertension    History reviewed. No pertinent surgical history. Family History  Problem Relation Age of Onset  . Heart disease Mother   . Hypertension Father   . Heart disease Maternal Grandfather   . Heart disease Paternal Grandfather    Social History  Substance Use Topics  . Smoking status: Former Research scientist (life sciences)  . Smokeless tobacco: Former Systems developer    Types: Chew  . Alcohol use 3.6 oz/week    6 Cans of beer per week    Review of Systems  Gastrointestinal: Positive for abdominal pain (Right groin). Negative for diarrhea, nausea and vomiting.  Genitourinary: Positive for scrotal swelling and testicular pain (Right side). Negative for decreased urine volume, discharge, dysuria, flank pain, frequency, genital sores, hematuria, penile pain and urgency.  Musculoskeletal: Negative for back pain and myalgias.    Allergies  Losartan potassium-hctz and Tramadol  Home Medications   Prior to Admission medications   Medication Sig Start Date End Date  Taking? Authorizing Provider  atorvastatin (LIPITOR) 20 MG tablet Take 1 tablet (20 mg total) by mouth daily. 12/03/15   Sean Hommel, DO  diazepam (VALIUM) 5 MG tablet One by mouth at bedtime for two weeks then half a tab at bedtime for one week. 12/01/15   Marcial Pacas, DO  doxycycline (VIBRAMYCIN) 100 MG capsule Take 1 capsule (100 mg total) by mouth 2 (two) times daily. One po bid x 7 days 05/21/16   Noland Fordyce, PA-C  HYDROcodone-acetaminophen (NORCO) 5-325 MG tablet Take 1 tablet by mouth every 8 (eight) hours as needed for moderate pain. 12/01/15   Marcial Pacas, DO  HYDROcodone-acetaminophen (NORCO/VICODIN) 5-325 MG tablet Take 1-2 tablets by mouth every 6 (six) hours as needed for moderate pain or severe pain. 05/21/16   Noland Fordyce, PA-C  ibuprofen (ADVIL,MOTRIN) 600 MG tablet Take 1 tablet (600 mg total) by mouth every 6 (six) hours as needed. 05/21/16   Noland Fordyce, PA-C  lisinopril-hydrochlorothiazide (PRINZIDE,ZESTORETIC) 20-25 MG tablet Take 1 tablet by mouth daily. 05/13/15   Marcial Pacas, DO   Meds Ordered and Administered this Visit   Medications  ketorolac (TORADOL) injection 60 mg (60 mg Intramuscular Given 05/21/16 1433)    BP 126/84 (BP Location: Right Arm)   Pulse 100   Temp 98 F (36.7 C) (Oral)   Wt 224 lb (101.6 kg)   SpO2 97%   BMI 30.38 kg/m  No data found.   Physical Exam  Constitutional: He is oriented to person, place, and time. He appears well-developed and well-nourished. He  appears distressed.  Pt standing in exam room, leaning over onto exam bed, appears uncomfortable. Does not want to sit due to pain.  HENT:  Head: Normocephalic and atraumatic.  Eyes: EOM are normal.  Neck: Normal range of motion.  Cardiovascular: Normal rate and regular rhythm.   Pulmonary/Chest: Effort normal and breath sounds normal. No respiratory distress. He has no wheezes.  Abdominal: Hernia confirmed negative in the right inguinal area.  Genitourinary: Penis normal. Right  testis shows tenderness. Right testis shows no mass and no swelling. Left testis shows no mass, no swelling and no tenderness.  Genitourinary Comments: Chaperoned exam. No hernia palpated in Right groin. Right testicle- tender to light touch. No obvious edema compared to Left side. Mild erythema compared to Left side. No rashes or sores.   Musculoskeletal: Normal range of motion.  Neurological: He is alert and oriented to person, place, and time.  Skin: Skin is warm and dry. He is not diaphoretic.  Psychiatric: He has a normal mood and affect. His behavior is normal.  Nursing note and vitals reviewed.   Urgent Care Course   Clinical Course    Procedures (including critical care time)  Labs Review Labs Reviewed  POCT URINALYSIS DIP (MANUAL ENTRY)    Imaging Review US Scrotum  Result Date: 05/21/2016 CLINICAL DATA:  Right testicular pain with radiation into right inguinal canal for 1 day. History of epididymitis in 2005. Vasectomy 2004. EXAM: SCROTAL ULTRASOUND DOPPLER ULTRASOUND OF THE TESTICLES TECHNIQUE: Complete ultrasound examination of the testicles, epididymis, and other scrotal structures was performed. Color and spectral Doppler ultrasound were also utilized to evaluate blood flow to the testicles. COMPARISON:  None. FINDINGS: Right testicle Measurements: 4.9 x 2.2 x 3.0 cm. No mass or microlithiasis visualized. Left testicle Measurements: 4.9 x 2.1 x 2.9 cm. No mass or microlithiasis visualized. Right epididymis: Mildly enlarged, and hyperemic, including on image 35. Tiny epididymal cyst versus spermatocele at 3 mm. Left epididymis:  Normal in size and appearance. Hydrocele:  Small left-sided hydrocele is likely physiologic. Varicocele:  None visualized. Pulsed Doppler interrogation of both testes demonstrates normal low resistance arterial and venous waveforms bilaterally. Normal color Doppler signal to both testicles. IMPRESSION: 1. Findings of right-sided epididymitis. 2. Tiny  right-sided epididymal cyst versus spermatocele. 3. Small left hydrocele. Electronically Signed   By: Abigail Miyamoto M.D.   On: 05/21/2016 16:28   [place] Art/ven Flow Abd Pelv Doppler  Result Date: 05/21/2016 CLINICAL DATA:  Right testicular pain with radiation into right inguinal canal for 1 day. History of epididymitis in 2005. Vasectomy 2004. EXAM: SCROTAL ULTRASOUND DOPPLER ULTRASOUND OF THE TESTICLES TECHNIQUE: Complete ultrasound examination of the testicles, epididymis, and other scrotal structures was performed. Color and spectral Doppler ultrasound were also utilized to evaluate blood flow to the testicles. COMPARISON:  None. FINDINGS: Right testicle Measurements: 4.9 x 2.2 x 3.0 cm. No mass or microlithiasis visualized. Left testicle Measurements: 4.9 x 2.1 x 2.9 cm. No mass or microlithiasis visualized. Right epididymis: Mildly enlarged, and hyperemic, including on image 35. Tiny epididymal cyst versus spermatocele at 3 mm. Left epididymis:  Normal in size and appearance. Hydrocele:  Small left-sided hydrocele is likely physiologic. Varicocele:  None visualized. Pulsed Doppler interrogation of both testes demonstrates normal low resistance arterial and venous waveforms bilaterally. Normal color Doppler signal to both testicles. IMPRESSION: 1. Findings of right-sided epididymitis. 2. Tiny right-sided epididymal cyst versus spermatocele. 3. Small left hydrocele. Electronically Signed   By: Abigail Miyamoto M.D.   On: 05/21/2016 16:28  MDM   1. Right epididymitis   2. Right groin pain   3. Right testicular pain    Pt c/o Right groin and Right testicular pain since last night. Hx of epididymitis several years ago. Denies concern for STI. No hx of kidney stones. Doubt appendicitis as pt is afebrile, denies n/v/d. Tenderness in Right groin and testicle rather than McBurney's point.  No hernia palpated on exam.  U/S scrotum: c/w Right-sided epididymitis.  Rx: Doxycycline for 10 days, Norco and  ibuprofen. Encouraged f/u with PCP next week for recheck of symptoms. Patient verbalized understanding and agreement with treatment plan.     Noland Fordyce, PA-C 05/21/16 1704

## 2016-05-21 NOTE — ED Triage Notes (Signed)
Pt sent to [facility] for u./s. They will call erin omalley pa with report.

## 2016-05-21 NOTE — ED Triage Notes (Signed)
Pt c/o right sided groin pain that radiates back. Started suddenly yesterday. No hx hernia. Vasectomy 12 years ago. Pain was intermittent but now constant. Denies strenuous activity/

## 2016-07-05 ENCOUNTER — Other Ambulatory Visit: Payer: Self-pay

## 2016-07-05 DIAGNOSIS — Z0000 Encounter for general adult medical examination without abnormal findings: Secondary | ICD-10-CM

## 2016-07-05 MED ORDER — LISINOPRIL-HYDROCHLOROTHIAZIDE 20-25 MG PO TABS: 1.0000 | ORAL_TABLET | Freq: Every day | ORAL | 0 refills | Status: DC

## 2016-07-13 ENCOUNTER — Telehealth: Payer: Self-pay | Admitting: Family Medicine

## 2016-07-13 NOTE — Telephone Encounter (Signed)
I called pt to inform him he needs to schedule an appt to switch his care to another pcp since Dr.Hommel has moved away and pt states he will call back

## 2016-08-01 ENCOUNTER — Other Ambulatory Visit: Payer: Self-pay | Admitting: Physician Assistant

## 2016-08-01 DIAGNOSIS — Z0000 Encounter for general adult medical examination without abnormal findings: Secondary | ICD-10-CM

## 2016-08-08 ENCOUNTER — Encounter: Payer: Self-pay | Admitting: Osteopathic Medicine

## 2016-08-08 ENCOUNTER — Ambulatory Visit (INDEPENDENT_AMBULATORY_CARE_PROVIDER_SITE_OTHER): Payer: Managed Care, Other (non HMO) | Admitting: Osteopathic Medicine

## 2016-08-08 ENCOUNTER — Ambulatory Visit (INDEPENDENT_AMBULATORY_CARE_PROVIDER_SITE_OTHER): Payer: Managed Care, Other (non HMO)

## 2016-08-08 VITALS — BP 144/98 | HR 91 | Ht 71.0 in | Wt 230.0 lb

## 2016-08-08 DIAGNOSIS — I10 Essential (primary) hypertension: Secondary | ICD-10-CM

## 2016-08-08 DIAGNOSIS — R7309 Other abnormal glucose: Secondary | ICD-10-CM

## 2016-08-08 DIAGNOSIS — E785 Hyperlipidemia, unspecified: Secondary | ICD-10-CM

## 2016-08-08 DIAGNOSIS — M25512 Pain in left shoulder: Secondary | ICD-10-CM | POA: Diagnosis not present

## 2016-08-08 MED ORDER — CYCLOBENZAPRINE HCL 10 MG PO TABS: 5.0000 mg | ORAL_TABLET | Freq: Three times a day (TID) | ORAL | 0 refills | Status: DC | PRN

## 2016-08-08 MED ORDER — LISINOPRIL-HYDROCHLOROTHIAZIDE 20-25 MG PO TABS: 1.0000 | ORAL_TABLET | Freq: Every day | ORAL | 3 refills | Status: DC

## 2016-08-08 MED ORDER — ATORVASTATIN CALCIUM 20 MG PO TABS: 20.0000 mg | ORAL_TABLET | Freq: Every day | ORAL | 3 refills | Status: DC

## 2016-08-08 MED ORDER — MELOXICAM 7.5 MG PO TABS: 7.5000 mg | ORAL_TABLET | Freq: Every day | ORAL | 1 refills | Status: DC
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
## 2016-08-17 ENCOUNTER — Encounter: Payer: Self-pay | Admitting: Sports Medicine

## 2016-08-17 ENCOUNTER — Ambulatory Visit (INDEPENDENT_AMBULATORY_CARE_PROVIDER_SITE_OTHER): Payer: Managed Care, Other (non HMO) | Admitting: Sports Medicine

## 2016-08-17 ENCOUNTER — Ambulatory Visit (INDEPENDENT_AMBULATORY_CARE_PROVIDER_SITE_OTHER): Payer: Managed Care, Other (non HMO)

## 2016-08-17 DIAGNOSIS — M5412 Radiculopathy, cervical region: Secondary | ICD-10-CM | POA: Insufficient documentation

## 2016-08-17 DIAGNOSIS — M25512 Pain in left shoulder: Secondary | ICD-10-CM

## 2016-08-17 DIAGNOSIS — M25519 Pain in unspecified shoulder: Secondary | ICD-10-CM

## 2016-08-17 LAB — CBC WITH DIFFERENTIAL/PLATELET
BASOS PCT: 0 %
Basophils Absolute: 0 cells/uL (ref 0–200)
EOS ABS: 124 {cells}/uL (ref 15–500)
Eosinophils Relative: 2 %
HEMATOCRIT: 44.9 % (ref 38.5–50.0)
Hemoglobin: 15.4 g/dL (ref 13.2–17.1)
Lymphocytes Relative: 31 %
Lymphs Abs: 1922 cells/uL (ref 850–3900)
MCH: 30.7 pg (ref 27.0–33.0)
MCHC: 34.3 g/dL (ref 32.0–36.0)
MCV: 89.4 fL (ref 80.0–100.0)
MONO ABS: 558 {cells}/uL (ref 200–950)
MPV: 9.2 fL (ref 7.5–12.5)
Monocytes Relative: 9 %
NEUTROS ABS: 3596 {cells}/uL (ref 1500–7800)
Neutrophils Relative %: 58 %
Platelets: 279 10*3/uL (ref 140–400)
RBC: 5.02 MIL/uL (ref 4.20–5.80)
RDW: 12.7 % (ref 11.0–15.0)
WBC: 6.2 10*3/uL (ref 3.8–10.8)

## 2016-08-17 MED ORDER — MELOXICAM 15 MG PO TABS: ORAL_TABLET | ORAL | 3 refills | Status: DC

## 2016-08-17 MED ORDER — TRAMADOL HCL 50 MG PO TABS: ORAL_TABLET | ORAL | 0 refills | Status: DC

## 2016-08-17 MED ORDER — PREDNISONE 50 MG PO TABS: ORAL_TABLET | ORAL | 0 refills | Status: DC

## 2016-08-17 NOTE — Assessment & Plan Note (Signed)
Left C6. X-rays, prednisone, tramadol, he can break his Flexeril and half, is somewhat drowsy in the morning. Aggressive formal physical therapy.

## 2016-08-17 NOTE — Progress Notes (Signed)
   Subjective:    I'm seeing this patient as a consultation for:  Dr. Emeterio Reeve  CC: Left neck and shoulder pain  HPI: 3 months ago this pleasant 66 her old male fell off the latter, onto an outstretched left arm, jammed his shoulder up. He had pain, around his shoulder but also numbness and tingling down to the thumb. Since then the pain is worse when he flexes his neck, it is also worse with overhead and reaching-type activities. Moderate, persistent with radiation as discussed.  Past medical history:  Negative.  See flowsheet/record as well for more information.  Surgical history: Negative.  See flowsheet/record as well for more information.  Family history: Negative.  See flowsheet/record as well for more information.  Social history: Negative.  See flowsheet/record as well for more information.  Allergies, and medications have been entered into the medical record, reviewed, and no changes needed.   Review of Systems: No headache, visual changes, nausea, vomiting, diarrhea, constipation, dizziness, abdominal pain, skin rash, fevers, chills, night sweats, weight loss, swollen lymph nodes, body aches, joint swelling, muscle aches, chest pain, shortness of breath, mood changes, visual or auditory hallucinations.   Objective:   General: Well Developed, well nourished, and in no acute distress.  Neuro/Psych: Alert and oriented x3, extra-ocular muscles intact, able to move all 4 extremities, sensation grossly intact. Skin: Warm and dry, no rashes noted.  Respiratory: Not using accessory muscles, speaking in full sentences, trachea midline.  Cardiovascular: Pulses palpable, no extremity edema. Abdomen: Does not appear distended. Neck: Positive Spurling sign with reproduction of left hand paresthesias Full neck range of motion Grip strength and sensation normal in bilateral hands Strength good C4 to T1 distribution No sensory change to C4 to T1 Reflexes normal Left  Shoulder: Inspection reveals no abnormalities, atrophy or asymmetry. Palpation is normal with no tenderness over AC joint or bicipital groove. ROM is full in all planes. Rotator cuff strength normal throughout. Mildly positive Neer and Hawkin's tests, empty can. Speeds and Yergason's tests normal. Strongly positive Obrien's, negative crank, negative clunk, and good stability. Normal scapular function observed. No painful arc and no drop arm sign. No apprehension sign  Impression and Recommendations:   This case required medical decision making of moderate complexity.  Left cervical radiculopathy Left C6. X-rays, prednisone, tramadol, he can break his Flexeril and half, is somewhat drowsy in the morning. Aggressive formal physical therapy.  Left shoulder pain With a positive crank and positive O'Brien signs. Likely has a labral tear after his fall. Formal physical therapy, return in one month, MRI arthrogram if no better.

## 2016-08-17 NOTE — Assessment & Plan Note (Signed)
With a positive crank and positive O'Brien signs. Likely has a labral tear after his fall. Formal physical therapy, return in one month, MRI arthrogram if no better.

## 2016-08-18 ENCOUNTER — Other Ambulatory Visit: Payer: Self-pay | Admitting: Sports Medicine

## 2016-08-18 LAB — LIPID PANEL
CHOLESTEROL: 193 mg/dL (ref <200)
HDL: 50 mg/dL (ref >40)
LDL CALC: 105 mg/dL — AB (ref <100)
TRIGLYCERIDES: 190 mg/dL — AB (ref <150)
Total CHOL/HDL Ratio: 3.9 Ratio (ref <5.0)
VLDL: 38 mg/dL — ABNORMAL HIGH (ref <30)

## 2016-08-18 LAB — COMPLETE METABOLIC PANEL WITH GFR
ALT: 30 U/L (ref 9–46)
AST: 22 U/L (ref 10–40)
Albumin: 4.9 g/dL (ref 3.6–5.1)
Alkaline Phosphatase: 56 U/L (ref 40–115)
BUN: 19 mg/dL (ref 7–25)
CALCIUM: 10.2 mg/dL (ref 8.6–10.3)
CO2: 28 mmol/L (ref 20–31)
CREATININE: 1.11 mg/dL (ref 0.60–1.35)
Chloride: 98 mmol/L (ref 98–110)
GFR, Est Non African American: 79 mL/min (ref >=60)
Glucose, Bld: 114 mg/dL — ABNORMAL HIGH (ref 65–99)
Potassium: 4.4 mmol/L (ref 3.5–5.3)
Sodium: 139 mmol/L (ref 135–146)
TOTAL PROTEIN: 7.7 g/dL (ref 6.1–8.1)
Total Bilirubin: 0.6 mg/dL (ref 0.2–1.2)

## 2016-08-18 LAB — TSH: TSH: 3.8 mIU/L (ref 0.40–4.50)

## 2016-08-18 MED ORDER — ONDANSETRON 8 MG PO TBDP: 8.0000 mg | ORAL_TABLET | Freq: Three times a day (TID) | ORAL | 3 refills | Status: DC | PRN

## 2016-08-18 NOTE — Addendum Note (Signed)
Addended by: Maryla Morrow on: 08/18/2016 09:44 PM   Modules accepted: Orders

## 2016-08-19 LAB — HEMOGLOBIN A1C
Hgb A1c MFr Bld: 5.1 % (ref <5.7)
Mean Plasma Glucose: 100 mg/dL

## 2016-09-01 ENCOUNTER — Encounter: Payer: Self-pay | Admitting: Sports Medicine

## 2016-09-01 ENCOUNTER — Ambulatory Visit (INDEPENDENT_AMBULATORY_CARE_PROVIDER_SITE_OTHER): Payer: Managed Care, Other (non HMO) | Admitting: Rehabilitative and Restorative Service Providers"

## 2016-09-01 ENCOUNTER — Other Ambulatory Visit: Payer: Self-pay | Admitting: Sports Medicine

## 2016-09-01 DIAGNOSIS — R29898 Other symptoms and signs involving the musculoskeletal system: Secondary | ICD-10-CM

## 2016-09-01 DIAGNOSIS — M25512 Pain in left shoulder: Secondary | ICD-10-CM

## 2016-09-01 DIAGNOSIS — R293 Abnormal posture: Secondary | ICD-10-CM

## 2016-09-01 DIAGNOSIS — M5412 Radiculopathy, cervical region: Secondary | ICD-10-CM
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
## 2016-09-01 NOTE — Patient Instructions (Signed)
Axial Extension (Chin Tuck)    Pull chin in and lengthen back of neck. Hold __5-10 sec __ seconds while counting out loud. Repeat _3-5___ times. Do _several___ sessions per day.  Shoulder Blade Squeeze   Can have swim noodle along spine for better posture  Rotate shoulders back, then squeeze shoulder blades down and back  Hold 5-10 sec Repeat _10__ times. Do _3-4___ sessions per day.  Working on posture and alignment  Massage with 4 inch ball  Tens unit  Cold or heat   TENS UNIT: This is helpful for muscle pain and spasm.   Search and Purchase a TENS 7000 2nd edition at www.tenspros.com. It should be less than $30.     TENS unit instructions: Do not shower or bathe with the unit on Turn the unit off before removing electrodes or batteries If the electrodes lose stickiness add a drop of water to the electrodes after they are disconnected from the unit and place on plastic sheet. If you continued to have difficulty, call the TENS unit company to purchase more electrodes. Do not apply lotion on the skin area prior to use. Make sure the skin is clean and dry as this will help prolong the life of the electrodes. After use, always check skin for unusual red areas, rash or other skin difficulties. If there are any skin problems, does not apply electrodes to the same area. Never remove the electrodes from the unit by pulling the wires. Do not use the TENS unit or electrodes other than as directed. Do not change electrode placement without consultating your therapist or physician. Keep 2 fingers with between each electrode.   Posture Tips DO: - stand tall and erect - keep chin tucked in - keep head and shoulders in alignment - check posture regularly in mirror or large window - pull head back against headrest in car seat;  Change your position often.  Sit with lumbar support. DON'T: - slouch or slump while watching TV or reading - sit, stand or lie in one position  for too long;   Sitting is especially hard on the spine so if you sit at a desk/use the computer, then stand up often!   Posture Tips DO: - stand tall and erect - keep chin tucked in - keep head and shoulders in alignment - check posture regularly in mirror or large window - pull head back against headrest in car seat;  Change your position often.  Sit with lumbar support. DON'T: - slouch or slump while watching TV or reading - sit, stand or lie in one position  for too long;  Sitting is especially hard on the spine so if you sit at a desk/use the computer, then stand up often!    Sleeping on Back  Place pillow under knees. A pillow with cervical support and a roll around waist are also helpful. Copyright  VHI. All rights reserved.  Sleeping on Side Place pillow between knees. Use cervical support under neck and a roll around waist as needed. Copyright  VHI. All rights reserved.   Sleeping on Stomach   If this is the only desirable sleeping position, place pillow under lower legs, and under stomach or chest as needed.  Posture - Sitting   Sit upright, head facing forward. Try using a roll to support lower back. Keep shoulders relaxed, and avoid rounded back. Keep hips level with knees. Avoid crossing legs for long periods. Stand to Sit / Sit to Stand   To sit:  Bend knees to lower self onto front edge of chair, then scoot back on seat. To stand: Reverse sequence by placing one foot forward, and scoot to front of seat. Use rocking motion to stand up.   Work Height and Reach  Ideal work height is no more than 2 to 4 inches below elbow level when standing, and at elbow level when sitting. Reaching should be limited to arm's length, with elbows slightly bent.  Bending  Bend at hips and knees, not back. Keep feet shoulder-width apart.    Posture - Standing   Good posture is important. Avoid slouching and forward head thrust. Maintain curve in low back and align ears over shoul- ders, hips over  ankles.  Alternating Positions   Alternate tasks and change positions frequently to reduce fatigue and muscle tension. Take rest breaks. Computer Work   Position work to Programmer, multimedia. Use proper work and seat height. Keep shoulders back and down, wrists straight, and elbows at right angles. Use chair that provides full back support. Add footrest and lumbar roll as needed.  Getting Into / Out of Car  Lower self onto seat, scoot back, then bring in one leg at a time. Reverse sequence to get out.  Dressing  Lie on back to pull socks or slacks over feet, or sit and bend leg while keeping back straight.    Housework - Sink  Place one foot on ledge of cabinet under sink when standing at sink for prolonged periods.   Pushing / Pulling  Pushing is preferable to pulling. Keep back in proper alignment, and use leg muscles to do the work.  Deep Squat   Squat and lift with both arms held against upper trunk. Tighten stomach muscles without holding breath. Use smooth movements to avoid jerking.  Avoid Twisting   Avoid twisting or bending back. Pivot around using foot movements, and bend at knees if needed when reaching for articles.  Carrying Luggage   Distribute weight evenly on both sides. Use a cart whenever possible. Do not twist trunk. Move body as a unit.   Lifting Principles .Maintain proper posture and head alignment. .Slide object as close as possible before lifting. .Move obstacles out of the way. .Test before lifting; ask for help if too heavy. .Tighten stomach muscles without holding breath. .Use smooth movements; do not jerk. .Use legs to do the work, and pivot with feet. .Distribute the work load symmetrically and close to the center of trunk. .Push instead of pull whenever possible.   Ask For Help   Ask for help and delegate to others when possible. Coordinate your movements when lifting together, and maintain the low back curve.  Log Roll   Lying on  back, bend left knee and place left arm across chest. Roll all in one movement to the right. Reverse to roll to the left. Always move as one unit. Housework - Sweeping  Use long-handled equipment to avoid stooping.   Housework - Wiping  Position yourself as close as possible to reach work surface. Avoid straining your back.  Laundry - Unloading Wash   To unload small items at bottom of washer, lift leg opposite to arm being used to reach.  [place] close to area to be raked. Use arm movements to do the work. Keep back straight and avoid twisting.     Cart  When reaching into cart with one arm, lift opposite leg to keep back straight.   Getting Into / Out  of Bed  Lower self to lie down on one side by raising legs and lowering head at the same time. Use arms to assist moving without twisting. Bend both knees to roll onto back if desired. To sit up, start from lying on side, and use same move-ments in reverse. Housework - Vacuuming  Hold the vacuum with arm held at side. Step back and forth to move it, keeping head up. Avoid twisting.   Laundry - IT consultant so that bending and twisting can be avoided.   Laundry - Unloading Dryer  Squat down to reach into clothes dryer or use a reacher.  Gardening - Weeding / Probation officer or Kneel. Knee pads may be helpful.

## 2016-09-02 MED ORDER — TRAMADOL HCL 50 MG PO TABS: ORAL_TABLET | ORAL | 0 refills | Status: DC

## 2016-09-06 ENCOUNTER — Ambulatory Visit (INDEPENDENT_AMBULATORY_CARE_PROVIDER_SITE_OTHER): Payer: Managed Care, Other (non HMO) | Admitting: Physical Therapy

## 2016-09-06 DIAGNOSIS — R29898 Other symptoms and signs involving the musculoskeletal system: Secondary | ICD-10-CM | POA: Diagnosis not present

## 2016-09-06 DIAGNOSIS — R293 Abnormal posture: Secondary | ICD-10-CM

## 2016-09-06 DIAGNOSIS — M5412 Radiculopathy, cervical region: Secondary | ICD-10-CM | POA: Diagnosis not present

## 2016-09-06 NOTE — Therapy (Signed)
[place], [place], [address] Phone: [phone]   Fax:  [phone]  Physical Therapy Treatment  Patient Details  Name: Adrian Mccarty MRN: [id number] Date of Birth: 1971-04-17 Referring Provider: Dr. Dianah Field  Encounter Date: 09/06/2016      PT End of Session - 09/06/16 0847    Visit Number 2   Number of Visits 12   Date for PT Re-Evaluation 10/13/16   PT Start Time 0848   PT Stop Time 0922   PT Time Calculation (min) 34 min   Activity Tolerance Patient limited by pain   Behavior During Therapy [facility] for tasks assessed/performed      Past Medical History:  Diagnosis Date  . Erectile dysfunction   . GERD (gastroesophageal reflux disease)   . Hyperlipidemia   . Hypertension     No past surgical history on file.  There were no vitals filed for this visit.      Subjective Assessment - 09/06/16 0851    Subjective Pt reports he feels no worse than last visit, maybe just a little better.  He bought a TENS unit and has been using it every night after work. He is unable to tolerate home exercises, though he has tried them numerous times.    Currently in Pain? Yes   Pain Score 3    Pain Location Neck   Pain Orientation Left   Pain Descriptors / Indicators Nagging;Numbness;Sharp   Pain Radiating Towards down Lt arm to fingers.    Aggravating Factors  looking in any direction but Rt   Pain Relieving Factors lying down, heat, ice, Aleve             [facility] PT Assessment - 09/06/16 0001      Assessment   Medical Diagnosis CervicaL Radiculopathy; Lt shoulder pain    Referring Provider Dr. Dianah Field   Onset Date/Surgical Date 06/01/16   Hand Dominance Right   Next MD Visit 3/18          [facility] Adult PT Treatment/Exercise - 09/06/16 0001      Self-Care   Self-Care Other Self-Care Comments;Posture   Posture Educated pt on posture in sitting and standing to decreased symptoms.  Pt  able to return demo with tactile/VC   Other Self-Care Comments  Reviewed self massage with ball to the neck/Lt shoulder girdle.  Pt returned demo.       Neck Exercises: Standing   Neck Retraction 5 reps;5 secs   Neck Retraction Limitations tactile and VC for form; increased symptoms when in neutral.    Other Standing Exercises shoulder circles x 10; scap squeeze around noodle x 5 sec x 5 reps (increased symptoms)      Neck Exercises: Supine   Cervical Isometrics --  chin tucks x 5 reps; unable to tolerate - stopped.      Modalities   Modalities Cryotherapy;Traction     Cryotherapy   Number Minutes Cryotherapy 10 Minutes   Cryotherapy Location Shoulder  Lt    Type of Cryotherapy Ice pack     Traction   Type of Traction Cervical   Min (lbs) 6   Max (lbs) 12   Hold Time 60   Rest Time 20   Time 12     Neck Exercises: Stretches   Upper Trapezius Stretch 2 reps;20 seconds                     PT Long Term Goals - 09/06/16  Angelina GOAL #1   Title Improve posture and alignment with patient to demonstrate upright posture with head in good alignment 10/13/16   Time 6   Period Weeks   Status On-going     PT LONG TERM GOAL #2   Title Decrease frequency and intensity of radicular symptoms and pain by 25-50% allowing patient to progress with functional activities 10/13/16   Time 6   Period Weeks   Status On-going     PT LONG TERM GOAL #3   Title Increase AROM cervical spine and Lt shoudler to [facility] 10/13/16   Time 6   Period Weeks   Status On-going     PT LONG TERM GOAL #4   Title Independent in HEP 10/13/16   Time 6   Period Weeks   Status On-going     PT LONG TERM GOAL #5   Title Improve FOTO to </= 38% limitation 10/13/16   Time 6   Period Weeks   Status On-going               Plan - 09/06/16 [id number]    Clinical Impression Statement Pt continues to have limited tolerance for exercise, reporting increased radicular symptoms in LUE.   Pt tolerated trial of traction well, noting decreased symptoms during and shortly after.  Pt will benefit from continued PT intervention to increase functional mobility independence and safety.    Rehab Potential Good   PT Frequency 2x / week   PT Duration 6 weeks   PT Treatment/Interventions Patient/family education;ADLs/Self Care Home Management;Neuromuscular re-education;Cryotherapy;Electrical Stimulation;Iontophoresis 4mg /ml Dexamethasone;Moist Heat;Traction;Ultrasound;Dry needling;Manual techniques;Therapeutic activities;Therapeutic exercise   PT Next Visit Plan Continue postural correction and gentle ROM.  Increase time and amount of pull for cervical traction.    Consulted and Agree with Plan of Care Patient      Patient will benefit from skilled therapeutic intervention in order to improve the following deficits and impairments:  Pain, Postural dysfunction, Improper body mechanics, Decreased range of motion, Decreased mobility, Decreased strength, Impaired UE functional use, Increased fascial restricitons, Increased muscle spasms, Decreased activity tolerance  Visit Diagnosis: Radiculopathy, cervical region  Abnormal posture  Other symptoms and signs involving the musculoskeletal system     Problem List Patient Active Problem List   Diagnosis Date Noted  . Left cervical radiculopathy 08/17/2016  . Left shoulder pain 08/17/2016  . White coat hypertension 05/13/2015  . Hypertension 11/15/2011  . Erectile dysfunction 11/15/2011  . HYPERCHOLESTEROLEMIA 05/15/2009   Kerin Perna, PTA 09/06/16 10:50 AM  [facility] [place], [place], [address] Phone: [phone]   Fax:  [phone]  Name: Adrian Mccarty MRN: [id number] Date of Birth: 05/10/1971

## 2016-09-09 ENCOUNTER — Ambulatory Visit (INDEPENDENT_AMBULATORY_CARE_PROVIDER_SITE_OTHER): Payer: Managed Care, Other (non HMO) | Admitting: Rehabilitative and Restorative Service Providers"

## 2016-09-09 ENCOUNTER — Encounter: Payer: Self-pay | Admitting: Rehabilitative and Restorative Service Providers"

## 2016-09-09 DIAGNOSIS — R29898 Other symptoms and signs involving the musculoskeletal system: Secondary | ICD-10-CM | POA: Diagnosis not present

## 2016-09-09 DIAGNOSIS — R293 Abnormal posture: Secondary | ICD-10-CM

## 2016-09-09 DIAGNOSIS — M5412 Radiculopathy, cervical region: Secondary | ICD-10-CM | POA: Diagnosis not present

## 2016-09-09 NOTE — Patient Instructions (Signed)
Scapula Adduction With Pectoralis Stretch: Low - Standing   Shoulders at 45 hands even with shoulders, keeping weight through legs, shift weight forward until you feel pull or stretch through the front of your chest. Hold _30__ seconds. Do _3__ times, _2-4__ times per day.   Scapula Adduction With Pectoralis Stretch: Mid-Range - Standing   Shoulders at 90 elbows even with shoulders, keeping weight through legs, shift weight forward until you feel pull or strength through the front of your chest. Hold __30_ seconds. Do _3__ times, __2-4_ times per day.   Scapula Adduction With Pectoralis Stretch: High - Standing   Shoulders at 120 hands up high on the doorway, keeping weight on feet, shift weight forward until you feel pull or stretch through the front of your chest. Hold _30__ seconds. Do _3__ times, _2-3__ times per day.  Trigger Point Dry Needling  . What is Trigger Point Dry Needling (DN)? o DN is a physical therapy technique used to treat muscle pain and dysfunction. Specifically, DN helps deactivate muscle trigger points (muscle knots).  o A thin filiform needle is used to penetrate the skin and stimulate the underlying trigger point. The goal is for a local twitch response (LTR) to occur and for the trigger point to relax. No medication of any kind is injected during the procedure.   . What Does Trigger Point Dry Needling Feel Like?  o The procedure feels different for each individual patient. Some patients report that they do not actually feel the needle enter the skin and overall the process is not painful. Very mild bleeding may occur. However, many patients feel a deep cramping in the muscle in which the needle was inserted. This is the local twitch response.   . How Will I feel after the treatment? o Soreness is normal, and the onset of soreness may not occur for a few hours. Typically this soreness does not last longer than two days.  o Bruising is uncommon, however; ice  can be used to decrease any possible bruising.  o In rare cases feeling tired or nauseous after the treatment is normal. In addition, your symptoms may get worse before they get better, this period will typically not last longer than 24 hours.   . What Can I do After My Treatment? o Increase your hydration by drinking more water for the next 24 hours. o You may place ice or heat on the areas treated that have become sore, however, do not use heat on inflamed or bruised areas. Heat often brings more relief post needling. o You can continue your regular activities, but vigorous activity is not recommended initially after the treatment for 24 hours. o DN is best combined with other physical therapy such as strengthening, stretching, and other therapies.   

## 2016-09-09 NOTE — Therapy (Signed)
[place], [place], [address] Phone: [phone]   Fax:  [phone]  Physical Therapy Treatment  Patient Details  Name: Adrian Mccarty MRN: [id number] Date of Birth: 11-09-1970 Referring Provider: Dr. Dianah Field  Encounter Date: 09/09/2016      PT End of Session - 09/09/16 0807    Visit Number 3   Number of Visits 12   Date for PT Re-Evaluation 10/13/16   PT Start Time 0804   PT Stop Time 0859   PT Time Calculation (min) 55 min   Activity Tolerance Patient tolerated treatment well      Past Medical History:  Diagnosis Date  . Erectile dysfunction   . GERD (gastroesophageal reflux disease)   . Hyperlipidemia   . Hypertension     History reviewed. No pertinent surgical history.  There were no vitals filed for this visit.      Subjective Assessment - 09/09/16 0808    Subjective Pt reports some improvement - no longer having constant symptoms. Notices symptoms with looking up or down or looking Lt and with sitting; driiving; working at computer.    Currently in Pain? Yes   Pain Score 3   spikes to [id number] with certain motions   Pain Location Neck   Pain Orientation Left   Pain Descriptors / Indicators Nagging;Numbness;Sharp   Pain Type Acute pain   Pain Onset More than a month ago                         [facility] Adult PT Treatment/Exercise - 09/09/16 0001      Shoulder Exercises: Standing   Other Standing Exercises scap squeeze with noodle 10 sec x 10 to pt tolerance      Shoulder Exercises: Stretch   Other Shoulder Stretches 3 way doorway to pat tolerance 20 - 30 sec x 2-3 reps 3 positions      Cryotherapy   Number Minutes Cryotherapy 15 Minutes   Cryotherapy Location Cervical;Shoulder  Lt   Type of Cryotherapy Ice pack     Traction   Type of Traction Cervical   Min (lbs) 10   Max (lbs) 18   Hold Time 60   Rest Time 20   Time 15     Manual Therapy    Manual therapy comments pt supine    Joint Mobilization CPA mobs lower cervical to mid cervical Grade II produces some Lt UE radicular pain    Soft tissue mobilization deep tissue work as tolerated through the Lt ant/lat/post cervical musculature; upper trap; pecs - through the clavicular area    Myofascial Release Lt lateral cervical    Manual Traction throughout the treatment to pt tolerance      Neck Exercises: Stretches   Other Neck Stretches axial extension 5-10sec x 3- 5; 2 different times during visit                 PT Education - 09/09/16 0853    Education provided Yes   Education Details HEP; DN    Person(s) Educated Patient   Methods Explanation;Demonstration;Tactile cues;Verbal cues;Handout   Comprehension Verbalized understanding;Returned demonstration;Verbal cues required;Tactile cues required             PT Long Term Goals - 09/06/16 1047      PT LONG TERM GOAL #1   Title Improve posture and alignment with patient to demonstrate upright posture with head in good alignment 10/13/16   Time  6   Period Weeks   Status On-going     PT LONG TERM GOAL #2   Title Decrease frequency and intensity of radicular symptoms and pain by 25-50% allowing patient to progress with functional activities 10/13/16   Time 6   Period Weeks   Status On-going     PT LONG TERM GOAL #3   Title Increase AROM cervical spine and Lt shoudler to [facility] 10/13/16   Time 6   Period Weeks   Status On-going     PT LONG TERM GOAL #4   Title Independent in HEP 10/13/16   Time 6   Period Weeks   Status On-going     PT LONG TERM GOAL #5   Title Improve FOTO to </= 38% limitation 10/13/16   Time 6   Period Weeks   Status On-going               Plan - 09/09/16 0850    Clinical Impression Statement Some improvement with radicular symptoms. Cotninues to have radicular pain with cervical extension but is able to move into more extension than he was at initial visit. Tolerated  manual work well reporting some muscle soreness and tightness but no increase in radicular symptoms. Noted good release with manual work followed by traction. Increased the pull on traction withoiut difficulty. Discussed home TENS unit which pt reports is helping manage pain.    Rehab Potential Good   PT Frequency 2x / week   PT Duration 6 weeks   PT Treatment/Interventions Patient/family education;ADLs/Self Care Home Management;Neuromuscular re-education;Cryotherapy;Electrical Stimulation;Iontophoresis 4mg /ml Dexamethasone;Moist Heat;Traction;Ultrasound;Dry needling;Manual techniques;Therapeutic activities;Therapeutic exercise   PT Next Visit Plan Continue postural correction and gentle ROM.  Increase time and amount of pull for cervical traction. consider trial of DN - patient is thinking about it    Consulted and Agree with Plan of Care Patient      Patient will benefit from skilled therapeutic intervention in order to improve the following deficits and impairments:  Pain, Postural dysfunction, Improper body mechanics, Decreased range of motion, Decreased mobility, Decreased strength, Impaired UE functional use, Increased fascial restricitons, Increased muscle spasms, Decreased activity tolerance  Visit Diagnosis: Radiculopathy, cervical region  Abnormal posture  Other symptoms and signs involving the musculoskeletal system     Problem List Patient Active Problem List   Diagnosis Date Noted  . Left cervical radiculopathy 08/17/2016  . Left shoulder pain 08/17/2016  . White coat hypertension 05/13/2015  . Hypertension 11/15/2011  . Erectile dysfunction 11/15/2011  . HYPERCHOLESTEROLEMIA 05/15/2009    Tateanna Bach Nilda Simmer PT, MPH  09/09/2016, 8:55 AM  [facility] [place], [place], [address] Phone: [phone]   Fax:  [phone]  Name: Adrian Mccarty MRN: [id number] Date of Birth: 06-22-1971

## 2016-09-16 ENCOUNTER — Ambulatory Visit (INDEPENDENT_AMBULATORY_CARE_PROVIDER_SITE_OTHER): Payer: Managed Care, Other (non HMO) | Admitting: Physical Therapy

## 2016-09-16 DIAGNOSIS — M5412 Radiculopathy, cervical region: Secondary | ICD-10-CM

## 2016-09-16 DIAGNOSIS — R29898 Other symptoms and signs involving the musculoskeletal system: Secondary | ICD-10-CM | POA: Diagnosis not present

## 2016-09-16 DIAGNOSIS — R293 Abnormal posture: Secondary | ICD-10-CM

## 2016-09-16 NOTE — Therapy (Signed)
[place], [place], [address] Phone: [phone]   Fax:  [phone]  Physical Therapy Treatment  Patient Details  Name: Adrian Mccarty MRN: [id number] Date of Birth: 1970-11-23 Referring Provider: Dr. Dianah Field  Encounter Date: 09/16/2016      PT End of Session - 09/16/16 0856    Visit Number 4   Number of Visits 12   Date for PT Re-Evaluation 10/13/16   PT Start Time 0850   PT Stop Time 0935   PT Time Calculation (min) 45 min   Activity Tolerance Patient tolerated treatment well   Behavior During Therapy [facility] for tasks assessed/performed      Past Medical History:  Diagnosis Date  . Erectile dysfunction   . GERD (gastroesophageal reflux disease)   . Hyperlipidemia   . Hypertension     No past surgical history on file.  There were no vitals filed for this visit.      Subjective Assessment - 09/16/16 0852    Subjective Pt reports he has been taking Mobic regularly for 1 wk, icing and using TENS 1x/day in evening. This has helped a lot. He has been able to sleep on Lt occasionally without pain.    Currently in Pain? No/denies   Pain Score 0-No pain            [facility] PT Assessment - 09/16/16 0001      Assessment   Medical Diagnosis CervicaL Radiculopathy; Lt shoulder pain    Referring Provider Dr. Dianah Field   Onset Date/Surgical Date 06/01/16   Hand Dominance Right   Next MD Visit 3/18     AROM   Left Shoulder Extension 55 Degrees   Left Shoulder Flexion 154 Degrees   Left Shoulder ABduction 102 Degrees  with pain; scaption    Left Shoulder Internal Rotation 39 Degrees  supine, abdct 50 deg   Left Shoulder External Rotation 79 Degrees  supine, abdct 50 deg   Cervical Flexion 52   Cervical Extension 47   Cervical - Right Side Bend 45   Cervical - Left Side Bend 20   Cervical - Right Rotation 76   Cervical - Left Rotation 40  with pain                     [facility] Adult PT Treatment/Exercise - 09/16/16 0001      Exercises   Exercises Shoulder;Neck     Neck Exercises: Machines for Strengthening   UBE (Upper Arm Bike) L1: 1.5 min, each direction.     Neck Exercises: Standing   Other Standing Exercises shoulder circles x 10; scap squeeze around noodle x 5 sec x 10 reps      Shoulder Exercises: Isometric Strengthening   Flexion 5X5"   External Rotation 5X5"   Internal Rotation 5X5"   ABduction 5X5"     Shoulder Exercises: Stretch   Other Shoulder Stretches 3 way doorway to pat tolerance 20 - 30 sec x 2-3 reps 3 positions      Cryotherapy   Number Minutes Cryotherapy 15 Minutes   Cryotherapy Location Shoulder   Type of Cryotherapy Ice pack     Electrical Stimulation   Electrical Stimulation Location Lt lateral neck/post shoulder   Electrical Stimulation Action IFC   Electrical Stimulation Parameters to tolerance   Electrical Stimulation Goals Tone;Pain     Traction   Type of Traction Cervical   Min (lbs) 10   Max (lbs) 18   Hold  Time 60   Rest Time 20   Time 15     Neck Exercises: Stretches   Upper Trapezius Stretch 2 reps;20 seconds   Levator Stretch 20 seconds;3 reps                PT Education - 09/16/16 0931    Education provided Yes   Education Details HEP, including Lt levator stretch.    Person(s) Educated Patient   Methods Explanation;Handout;Demonstration;Verbal cues   Comprehension Verbalized understanding;Returned demonstration             PT Long Term Goals - 09/06/16 1047      PT LONG TERM GOAL #1   Title Improve posture and alignment with patient to demonstrate upright posture with head in good alignment 10/13/16   Time 6   Period Weeks   Status On-going     PT LONG TERM GOAL #2   Title Decrease frequency and intensity of radicular symptoms and pain by 25-50% allowing patient to progress with functional activities 10/13/16   Time 6   Period Weeks   Status On-going     PT LONG  TERM GOAL #3   Title Increase AROM cervical spine and Lt shoudler to [facility] 10/13/16   Time 6   Period Weeks   Status On-going     PT LONG TERM GOAL #4   Title Independent in HEP 10/13/16   Time 6   Period Weeks   Status On-going     PT LONG TERM GOAL #5   Title Improve FOTO to </= 38% limitation 10/13/16   Time 6   Period Weeks   Status On-going               Plan - 09/16/16 1259    Clinical Impression Statement Pt demonstrated improved cervical ROM. He is reporting reduced radicular symptoms and increased awareness of postures to avoid (which increase symptoms).  He tolerated gentle isometrics for Lt shoulder with only minor soreness.  Pt able to tolerate scap squeeze with VC of neutral head position.  Progressing towards goals.    Rehab Potential Good   PT Frequency 2x / week   PT Duration 6 weeks   PT Treatment/Interventions Patient/family education;ADLs/Self Care Home Management;Neuromuscular re-education;Cryotherapy;Electrical Stimulation;Iontophoresis 4mg /ml Dexamethasone;Moist Heat;Traction;Ultrasound;Dry needling;Manual techniques;Therapeutic activities;Therapeutic exercise   PT Next Visit Plan Continue postural correction and gentle ROM; traction. Assess response to new HEP exercises.   Consulted and Agree with Plan of Care Patient      Patient will benefit from skilled therapeutic intervention in order to improve the following deficits and impairments:  Pain, Postural dysfunction, Improper body mechanics, Decreased range of motion, Decreased mobility, Decreased strength, Impaired UE functional use, Increased fascial restricitons, Increased muscle spasms, Decreased activity tolerance  Visit Diagnosis: Radiculopathy, cervical region  Abnormal posture  Other symptoms and signs involving the musculoskeletal system     Problem List Patient Active Problem List   Diagnosis Date Noted  . Left cervical radiculopathy 08/17/2016  . Left shoulder pain 08/17/2016  .  White coat hypertension 05/13/2015  . Hypertension 11/15/2011  . Erectile dysfunction 11/15/2011  . HYPERCHOLESTEROLEMIA 05/15/2009   Kerin Perna, PTA 09/16/16 1:06 PM  [place], [place], [address] Phone: [phone]   Fax:  [phone]  Name: Adrian Mccarty MRN: [id number] Date of Birth: 1970/08/13

## 2016-09-16 NOTE — Patient Instructions (Signed)
Strengthening: Isometric Flexion  Using wall for resistance, press right fist into ball using light pressure. Hold __5-10__ seconds. Repeat __10__ times per set. Do __1__ sets per session. Do __1-2__ sessions per day.  SHOULDER: Abduction (Isometric)  Use wall as resistance. Press arm against pillow. Keep elbow straight. Hold __5_ seconds. __10_ reps per set, _1__ sets per day, _5__ days per week  Internal Rotation (Isometric)  Place palm of right fist against door frame, with elbow bent. Press fist against door frame. Hold _5-10___ seconds. Repeat __10__ times. Do __1__ sessions per day.  External Rotation (Isometric)  Place back of left fist against door frame, with elbow bent. Press fist against door frame. Hold __5-10__ seconds. Repeat ___10_ times. Do _1___ sessions per day.   [facility] [place], [place] [address]  [phone] (office) [phone] (fax)

## 2016-09-20 ENCOUNTER — Encounter: Payer: Self-pay | Admitting: Sports Medicine

## 2016-09-20 ENCOUNTER — Other Ambulatory Visit: Payer: Self-pay | Admitting: Sports Medicine

## 2016-09-20 DIAGNOSIS — M25512 Pain in left shoulder: Secondary | ICD-10-CM

## 2016-09-20 DIAGNOSIS — M24112 Other articular cartilage disorders, left shoulder: Secondary | ICD-10-CM

## 2016-09-22 MED ORDER — TRAMADOL HCL 50 MG PO TABS: ORAL_TABLET | ORAL | 0 refills | Status: DC

## 2016-09-22 NOTE — Telephone Encounter (Signed)
Patient request refill for Tramadol 50 mg. #[id number] refills approved. Roshawn Ayala,CMA

## 2016-09-23 ENCOUNTER — Telehealth: Payer: Self-pay

## 2016-09-23 ENCOUNTER — Encounter: Payer: Managed Care, Other (non HMO) | Admitting: Physical Therapy

## 2016-09-23 ENCOUNTER — Other Ambulatory Visit: Payer: Self-pay | Admitting: Sports Medicine

## 2016-09-23 DIAGNOSIS — M25512 Pain in left shoulder: Secondary | ICD-10-CM

## 2016-09-23 NOTE — Telephone Encounter (Signed)
The patients MRI has been authorized, but patient has chosen to have the procedure at [facility].  The Auth # [id number]. Location has also been updated on authorization also.  Could you send the order there?

## 2016-09-26 ENCOUNTER — Ambulatory Visit (INDEPENDENT_AMBULATORY_CARE_PROVIDER_SITE_OTHER): Payer: Managed Care, Other (non HMO) | Admitting: Physical Therapy

## 2016-09-26 DIAGNOSIS — R29898 Other symptoms and signs involving the musculoskeletal system: Secondary | ICD-10-CM | POA: Diagnosis not present

## 2016-09-26 DIAGNOSIS — M5412 Radiculopathy, cervical region: Secondary | ICD-10-CM

## 2016-09-26 DIAGNOSIS — R293 Abnormal posture: Secondary | ICD-10-CM | POA: Diagnosis not present

## 2016-09-26 NOTE — Telephone Encounter (Signed)
Thank you, I will print out the order requisition from the imaging tab, sign it, and give it to you to fax.  See you in about 30 seconds!

## 2016-09-26 NOTE — Therapy (Addendum)
[place] [place], [place], [address] Phone: [phone]   Fax:  [phone]  Physical Therapy Treatment  Patient Details  Name: Adrian Mccarty MRN: [id number] Date of Birth: 1970/09/08 Referring Provider: Dr. Dianah Field  Encounter Date: 09/26/2016      PT End of Session - 09/26/16 0812    Visit Number 5   Number of Visits 12   Date for PT Re-Evaluation 10/13/16   PT Start Time 0805  pt arrived late   PT Stop Time 0854   PT Time Calculation (min) 49 min   Activity Tolerance Patient tolerated treatment well   Behavior During Therapy [facility] for tasks assessed/performed      Past Medical History:  Diagnosis Date  . Erectile dysfunction   . GERD (gastroesophageal reflux disease)   . Hyperlipidemia   . Hypertension     No past surgical history on file.  There were no vitals filed for this visit.      Subjective Assessment - 09/26/16 0820    Subjective Pt tried raking this weekend; only last 5 min before pain significantly increased.  "Pain is not too bad as long as I don't do certain things like tuck my chin".    Currently in Pain? No/denies   Pain Score 0-No pain            [facility] PT Assessment - 09/26/16 0001      Assessment   Medical Diagnosis CervicaL Radiculopathy; Lt shoulder pain    Referring Provider Dr. Dianah Field   Onset Date/Surgical Date 06/01/16   Hand Dominance Right   Next MD Visit 3/18     AROM   Cervical Flexion 56   Cervical Extension 45   Cervical - Right Side Bend 45   Cervical - Left Side Bend 30   Cervical - Right Rotation 76   Cervical - Left Rotation 70                     [facility] Adult PT Treatment/Exercise - 09/26/16 0001      Neck Exercises: Standing   Other Standing Exercises scap retraction around noodle x 5 sec x 10 reps     Neck Exercises: Seated   Other Seated Exercise thoracic ext over back of chair 5 sec x 10 reps (no increase in  symptoms)     Neck Exercises: Supine   Other Supine Exercise thoracic ext over pool noodle (perpendicular to spine) with hands supporting head x 2 min      Shoulder Exercises: Standing   Extension Strengthening;Both;10 reps;Theraband   Theraband Level (Shoulder Extension) Level 2 (Red)  (2 sets)   Row Both;10 reps;Strengthening;Theraband   Theraband Level (Shoulder Row) Level 1 (Yellow);Level 2 (Red)  (2 sets)   Retraction Strengthening;Both;10 reps;Theraband  2 sets   Theraband Level (Shoulder Retraction) Level 1 (Yellow)     Shoulder Exercises: Stretch   Other Shoulder Stretches 3 way doorway to pat tolerance 20 - 30 sec x 2-3 reps 3 positions      Cryotherapy   Number Minutes Cryotherapy 15 Minutes   Cryotherapy Location Shoulder   Type of Cryotherapy Ice pack     Traction   Type of Traction Cervical   Min (lbs) 10   Max (lbs) 18   Hold Time 60   Rest Time 20   Time 19                PT Education - 09/26/16 4580  Education provided Yes   Education Details HEP   Person(s) Educated Patient   Methods Explanation   Comprehension Verbalized understanding             PT Long Term Goals - 09/26/16 1035      PT LONG TERM GOAL #1   Title Improve posture and alignment with patient to demonstrate upright posture with head in good alignment 10/13/16   Time 6   Period Weeks   Status On-going  req frequent cues     PT LONG TERM GOAL #2   Title Decrease frequency and intensity of radicular symptoms and pain by 25-50% allowing patient to progress with functional activities 10/13/16   Time 6   Period Weeks   Status Partially Met     PT LONG TERM GOAL #3   Title Increase AROM cervical spine and Lt shoudler to [facility] 10/13/16   Time 6   Period Weeks   Status Partially Met     PT LONG TERM GOAL #4   Title Independent in HEP 10/13/16   Time 6   Period Weeks   Status On-going     PT LONG TERM GOAL #5   Title Improve FOTO to </= 38% limitation 10/13/16    Time 6   Period Weeks   Status On-going               Plan - 09/26/16 1032    Clinical Impression Statement Pt reporting reduced radicular symptoms, now only in Lt thumb.  He tolerated all exercises well without increase in symptoms.  Cervical ROM has improved with Lt side bend and rotation.  Progressing towards goals; partially met LTG 2 and 3.    Rehab Potential Good   PT Frequency 2x / week   PT Duration 6 weeks   PT Treatment/Interventions Patient/family education;ADLs/Self Care Home Management;Neuromuscular re-education;Cryotherapy;Electrical Stimulation;Iontophoresis 31m/ml Dexamethasone;Moist Heat;Traction;Ultrasound;Dry needling;Manual techniques;Therapeutic activities;Therapeutic exercise   PT Next Visit Plan Continue postural correction and gentle ROM; traction. Assess response to new HEP exercises.   Consulted and Agree with Plan of Care Patient      Patient will benefit from skilled therapeutic intervention in order to improve the following deficits and impairments:  Pain, Postural dysfunction, Improper body mechanics, Decreased range of motion, Decreased mobility, Decreased strength, Impaired UE functional use, Increased fascial restricitons, Increased muscle spasms, Decreased activity tolerance  Visit Diagnosis: Radiculopathy, cervical region  Abnormal posture  Other symptoms and signs involving the musculoskeletal system     Problem List Patient Active Problem List   Diagnosis Date Noted  . Left cervical radiculopathy 08/17/2016  . Left shoulder pain 08/17/2016  . White coat hypertension 05/13/2015  . Hypertension 11/15/2011  . Erectile dysfunction 11/15/2011  . HYPERCHOLESTEROLEMIA 05/15/2009   JKerin Perna PTA 09/26/16 10:36 AM  [facility] [address] [address]Phone: [phone]  Fax:  [phone] Name: Adrian DannemillerMRN: [id number] of Birth:  804/11/1970 PHYSICAL THERAPY DISCHARGE SUMMARY  Visits from Start of Care: 5  Current functional level related to goals / functional outcomes: See progress note for discharge status   Remaining deficits: See progress note for deficits at time of last PT visit   Education / Equipment: HEP Plan: Patient agrees to discharge.  Patient goals were partially met. Patient is being discharged due to not returning since the last visit.  ?????     Celyn P. HHelene KelpPT, MPH 10/21/16 9:20 AM

## 2016-09-26 NOTE — Telephone Encounter (Signed)
Order faxed.

## 2016-09-26 NOTE — Patient Instructions (Signed)
Resisted External Rotation: in Neutral - Bilateral   PALMS UP Sit or stand, tubing in both hands, elbows at sides, bent to 90, forearms forward. Pinch shoulder blades together and rotate forearms out. Keep elbows at sides. Repeat __10__ times per set. Do _2-3___ sets per session. Do _2-3___ sessions per day.   Low Row: Standing   Face anchor, feet shoulder width apart. Palms up, pull arms back, squeezing shoulder blades together. Repeat 10__ times per set. Do 2-3__ sets per session. Do 2-3__ sessions per week. Anchor Height: Waist  Strengthening: Resisted Extension   Hold tubing in both hands, arms forward. Pull arms back, elbow straight. Repeat _10___ times per set. Do 2-3____ sets per session. Do 2-3____ sessions per week.   [facility] at [facility] [place], [place] [address]  [phone] (office) [phone] (fax)

## 2016-09-27 NOTE — Telephone Encounter (Signed)
Preesh! 

## 2016-09-27 NOTE — Telephone Encounter (Signed)
Received fax back from [facility] stating they do not preform MR Arthrograms. Spoke with Pt, he is going to call his insurance and see if he can get this scheduled at our building. Will hold order until Pt returns call.

## 2017-08-13 ENCOUNTER — Other Ambulatory Visit: Payer: Self-pay | Admitting: Osteopathic Medicine

## 2017-08-13 DIAGNOSIS — I10 Essential (primary) hypertension: Secondary | ICD-10-CM

## 2017-09-11 ENCOUNTER — Other Ambulatory Visit: Payer: Self-pay

## 2017-09-11 DIAGNOSIS — I10 Essential (primary) hypertension: Secondary | ICD-10-CM

## 2017-09-11 MED ORDER — LISINOPRIL-HYDROCHLOROTHIAZIDE 20-25 MG PO TABS: 1.0000 | ORAL_TABLET | Freq: Every day | ORAL | 0 refills | Status: DC

## 2017-09-15 ENCOUNTER — Other Ambulatory Visit: Payer: Self-pay | Admitting: Osteopathic Medicine

## 2017-09-15 DIAGNOSIS — E785 Hyperlipidemia, unspecified: Secondary | ICD-10-CM

## 2017-09-18 ENCOUNTER — Ambulatory Visit: Payer: Managed Care, Other (non HMO) | Admitting: Osteopathic Medicine

## 2017-09-18 ENCOUNTER — Encounter: Payer: Self-pay | Admitting: Osteopathic Medicine

## 2017-09-18 VITALS — BP 131/89 | HR 90 | Temp 97.7°F | Wt 208.1 lb

## 2017-09-18 DIAGNOSIS — Z0000 Encounter for general adult medical examination without abnormal findings: Secondary | ICD-10-CM | POA: Diagnosis not present

## 2017-09-18 DIAGNOSIS — I10 Essential (primary) hypertension: Secondary | ICD-10-CM

## 2017-09-18 DIAGNOSIS — E785 Hyperlipidemia, unspecified: Secondary | ICD-10-CM | POA: Diagnosis not present

## 2017-09-18 MED ORDER — ATORVASTATIN CALCIUM 10 MG PO TABS: 10.0000 mg | ORAL_TABLET | Freq: Every day | ORAL | 3 refills | Status: DC

## 2017-09-18 MED ORDER — LISINOPRIL-HYDROCHLOROTHIAZIDE 20-25 MG PO TABS: 1.0000 | ORAL_TABLET | Freq: Every day | ORAL | 3 refills | Status: DC

## 2017-09-18 NOTE — Patient Instructions (Addendum)
Hydrocortisone cream over the counter for rash as needed.  Goal BP: 130/80 or under

## 2017-09-18 NOTE — Progress Notes (Signed)
HPI: Adrian Mccarty is a 47 y.o. male who  has a past medical history of Erectile dysfunction, GERD (gastroesophageal reflux disease), Hyperlipidemia, and Hypertension.  he presents to [facility] today, 09/18/17,  for chief complaint of: Annual physical   Patient here for annual physical / wellness exam.  See preventive care reviewed as below.  Recent labs reviewed in detail with the patient.   Additional concerns today include:  Hypertension f/u: needs refill on meds. BP higher on intake, recent death in the family, father in law.  Successful weight loss w/ weight watchers app!      Past medical, surgical, social and family history reviewed:  Patient Active Problem List   Diagnosis Date Noted  . Left cervical radiculopathy 08/17/2016  . Left shoulder pain 08/17/2016  . White coat hypertension 05/13/2015  . Hypertension 11/15/2011  . Erectile dysfunction 11/15/2011  . HYPERCHOLESTEROLEMIA 05/15/2009    No past surgical history on file.  Social History   Tobacco Use  . Smoking status: Former Research scientist (life sciences)  . Smokeless tobacco: Former Systems developer    Types: Chew  Substance Use Topics  . Alcohol use: Yes    Alcohol/week: 3.6 oz    Types: 6 Cans of beer per week    Family History  Problem Relation Age of Onset  . Heart disease Mother   . Hypertension Father   . Heart disease Maternal Grandfather   . Heart disease Paternal Grandfather      Current medication list and allergy/intolerance information reviewed:    Current Outpatient Medications  Medication Sig Dispense Refill  . atorvastatin (LIPITOR) 20 MG tablet Take 1 tablet (20 mg total) by mouth daily. Pt needs f/u appt w/PCP for further refills. 30 tablet 0  . cyclobenzaprine (FLEXERIL) 10 MG tablet Take 0.5 tablets (5 mg total) by mouth at bedtime. 30 tablet 0  . lisinopril-hydrochlorothiazide (PRINZIDE,ZESTORETIC) 20-25 MG tablet Take 1 tablet by mouth daily. Pt needs f/u appt w/PCP for  further refills 15 tablet 0  . meloxicam (MOBIC) 15 MG tablet One tab PO qAM with breakfast for 2 weeks, then daily prn pain. (Patient not taking: Reported on 09/01/2016) 30 tablet 3  . ondansetron (ZOFRAN-ODT) 8 MG disintegrating tablet Take 1 tablet (8 mg total) by mouth every 8 (eight) hours as needed for nausea. (Patient not taking: Reported on 09/01/2016) 20 tablet 3  . predniSONE (DELTASONE) 50 MG tablet One tab PO daily for 5 days. (Patient not taking: Reported on 09/01/2016) 5 tablet 0  . traMADol (ULTRAM) 50 MG tablet TAKE 1 TO 2 TABLETS BY MOUTH EVERY 8 HOURS PPA,MAX 6 TABS PER DAY 30 tablet 0   No current facility-administered medications for this visit.     Allergies  Allergen Reactions  . Losartan Potassium-Hctz     Erectile Dysfunction  . Tramadol     nausea      Review of Systems:  Constitutional:  No  fever, no chills, No recent illness, No unintentional weight changes. No significant fatigue.   HEENT: No  headache, no vision change, no hearing change, No  sinus pressure  Cardiac: No  chest pain, No  pressure, No palpitations, No  Orthopnea  Respiratory:  No  shortness of breath. No  Cough  Gastrointestinal: No  abdominal pain, No  nausea, No  vomiting,  No  blood in stool, No  diarrhea, No  constipation   Musculoskeletal: No new myalgia/arthralgia  Skin: +occasional itching Rash, No other wounds/concerning lesions  Genitourinary: No  incontinence, No  abnormal genital bleeding, No abnormal genital discharge  Hem/Onc: No  easy bruising/bleeding, No  abnormal lymph node  Endocrine: No cold intolerance,  No heat intolerance. No polyuria/polydipsia/polyphagia   Neurologic: No  weakness, No  dizziness, No  slurred speech/focal weakness/facial droop  Psychiatric: No  concerns with depression, No  concerns with anxiety, No sleep problems, No mood problems  Exam:  BP 131/89   Pulse 90   Temp 97.7 F (36.5 C) (Oral)   Wt 208 lb 1.9 oz (94.4 kg)   BMI 29.03 kg/m     Constitutional: VS see above. General Appearance: alert, well-developed, well-nourished, NAD  Eyes: Normal lids and conjunctive, non-icteric sclera  Ears, Nose, Mouth, Throat: MMM, Normal external inspection ears/nares/mouth/lips/gums. TM normal bilaterally. Pharynx/tonsils no erythema, no exudate. Nasal mucosa normal.   Neck: No masses, trachea midline. No thyroid enlargement. No tenderness/mass appreciated. No lymphadenopathy  Respiratory: Normal respiratory effort. no wheeze, no rhonchi, no rales  Cardiovascular: S1/S2 normal, no murmur, no rub/gallop auscultated. RRR. No lower extremity edema.   Gastrointestinal: Nontender, no masses. No hepatomegaly, no splenomegaly. No hernia appreciated. Bowel sounds normal. Rectal exam deferred.   Musculoskeletal: Gait normal. No clubbing/cyanosis of digits.   Neurological: Normal balance/coordination. No tremor. No cranial nerve deficit on limited exam. Motor and sensation intact and symmetric. Cerebellar reflexes intact. PERRL   Skin: warm, dry, intact. No rash/ulcer. No concerning nevi or subq nodules on limited exam.    Psychiatric: Normal judgment/insight. Normal mood and affect. Oriented x3.    Reviewed lab results from about a year ago. A1c normal range despite slightly elevated glucose level on CMP. Cholesterol panel showed a limited triglycerides at 190, slightly high LDL at 105. Otherwise no concerns on blood work   ASSESSMENT/PLAN:    Annual physical exam - Plan: CBC, COMPLETE METABOLIC PANEL WITH GFR, Lipid panel  Hyperlipidemia, unspecified hyperlipidemia type - Plan: atorvastatin (LIPITOR) 10 MG tablet, Lipid panel  Essential hypertension - Advise when back for labs, schedule nurse visit to verify home blood pressure cuff - Plan: lisinopril-hydrochlorothiazide (PRINZIDE,ZESTORETIC) 20-25 MG tablet, CBC, COMPLETE METABOLIC PANEL WITH GFR, Lipid panel     MALE PREVENTIVE CARE  updated 09/18/17  ANNUAL  SCREENING/COUNSELING  Any changes to health in the past year? None major   Diet/Exercise - HEALTHY HABITS DISCUSSED TO DECREASE CV RISK Social History   Tobacco Use  Smoking Status Former Smoker  . Last attempt to quit: 1994  . Years since quitting: 25.1  Smokeless Tobacco Former Systems developer  . Types: Chew   Social History   Substance and Sexual Activity  Alcohol Use Yes  . Alcohol/week: 3.6 oz  . Types: 6 Cans of beer per week   Depression screen [facility] 2/9 09/18/2017  Decreased Interest 0  Down, Depressed, Hopeless 0  PHQ - 2 Score 0  Altered sleeping 0  Tired, decreased energy 0  Change in appetite 0  Feeling bad or failure about yourself  0  Trouble concentrating 0  Moving slowly or fidgety/restless 0  Suicidal thoughts 0  PHQ-9 Score 0    SEXUAL/REPRODUCTIVE HEALTH  Sexually active in the past year? - Yes with male.  STI testing needed/desired today? - no  Any concerns with testosterone/libido? - no  INFECTIOUS DISEASE SCREENING  HIV - does not need  GC/CT - does not need  HepC - does not need  TB - does not need  CANCER SCREENING  Lung - does not need  Colon - does not need  Prostate - does not need  OTHER DISEASE SCREENING  Lipid - needs  DM2 - needs  AAA - does not need  Osteoporosis - men 48yo+ - does not need  ADULT VACCINATION  Influenza - annual vaccine recommended  Td - booster every 10 years   Zoster - Shingrix recommended 64+ years old  PCV13 - was not indicated  PPSV23 - was not indicated Immunization History  Administered Date(s) Administered  . Influenza Split 06/28/2011  . Tdap 06/28/2011     Patient Instructions  Hydrocortisone cream over the counter for rash as needed.  Goal BP: 130/80 or under      Visit summary with medication list and pertinent instructions was printed for patient to review. All questions at time of visit were answered - patient instructed to contact office with any additional concerns.  ER/RTC precautions were reviewed with the patient.   Follow-up plan: Return in about 1 year (around 09/19/2018) for [place], in one month for nurse visit BP check w/ cuff and get labs then to recheck cholesterol .    Please note: voice recognition software was used to produce this document, and typos may escape review. Please contact Dr. Sheppard Coil for any needed clarifications.

## 2018-01-11 ENCOUNTER — Encounter: Payer: Self-pay | Admitting: Osteopathic Medicine

## 2018-01-11 ENCOUNTER — Other Ambulatory Visit: Payer: Self-pay | Admitting: Osteopathic Medicine

## 2018-01-11 MED ORDER — SILDENAFIL CITRATE 20 MG PO TABS: ORAL_TABLET | ORAL | 2 refills | Status: DC

## 2018-01-11 NOTE — Progress Notes (Signed)
Refill sildenafil. 

## 2018-03-03 LAB — COMPLETE METABOLIC PANEL WITH GFR
AG RATIO: 1.9 (calc) (ref 1.0–2.5)
ALBUMIN MSPROF: 4.9 g/dL (ref 3.6–5.1)
ALT: 19 U/L (ref 9–46)
AST: 19 U/L (ref 10–40)
Alkaline phosphatase (APISO): 54 U/L (ref 40–115)
BUN: 18 mg/dL (ref 7–25)
CALCIUM: 10 mg/dL (ref 8.6–10.3)
CHLORIDE: 98 mmol/L (ref 98–110)
CO2: 27 mmol/L (ref 20–32)
Creat: 0.92 mg/dL (ref 0.60–1.35)
GFR, EST AFRICAN AMERICAN: 114 mL/min/{1.73_m2} (ref >=60)
GFR, EST NON AFRICAN AMERICAN: 98 mL/min/{1.73_m2} (ref >=60)
GLUCOSE: 98 mg/dL (ref 65–99)
Globulin: 2.6 g/dL (calc) (ref 1.9–3.7)
Potassium: 4.5 mmol/L (ref 3.5–5.3)
Sodium: 136 mmol/L (ref 135–146)
TOTAL PROTEIN: 7.5 g/dL (ref 6.1–8.1)
Total Bilirubin: 0.7 mg/dL (ref 0.2–1.2)

## 2018-03-03 LAB — CBC
HCT: 45.1 % (ref 38.5–50.0)
HEMOGLOBIN: 15.1 g/dL (ref 13.2–17.1)
MCH: 30.3 pg (ref 27.0–33.0)
MCHC: 33.5 g/dL (ref 32.0–36.0)
MCV: 90.6 fL (ref 80.0–100.0)
MPV: 9.5 fL (ref 7.5–12.5)
Platelets: 276 10*3/uL (ref 140–400)
RBC: 4.98 10*6/uL (ref 4.20–5.80)
RDW: 11.7 % (ref 11.0–15.0)
WBC: 6.1 10*3/uL (ref 3.8–10.8)

## 2018-03-03 LAB — LIPID PANEL
CHOL/HDL RATIO: 3.7 (calc) (ref <5.0)
Cholesterol: 190 mg/dL (ref <200)
HDL: 51 mg/dL (ref >40)
LDL CHOLESTEROL (CALC): 113 mg/dL — AB
NON-HDL CHOLESTEROL (CALC): 139 mg/dL — AB (ref <130)
TRIGLYCERIDES: 145 mg/dL (ref <150)

## 2018-03-21 ENCOUNTER — Other Ambulatory Visit: Payer: Self-pay | Admitting: Sports Medicine

## 2018-03-21 DIAGNOSIS — M5412 Radiculopathy, cervical region: Secondary | ICD-10-CM

## 2018-05-04 ENCOUNTER — Ambulatory Visit (INDEPENDENT_AMBULATORY_CARE_PROVIDER_SITE_OTHER): Payer: Managed Care, Other (non HMO)

## 2018-05-04 ENCOUNTER — Ambulatory Visit (INDEPENDENT_AMBULATORY_CARE_PROVIDER_SITE_OTHER): Payer: Managed Care, Other (non HMO) | Admitting: Osteopathic Medicine

## 2018-05-04 ENCOUNTER — Encounter: Payer: Self-pay | Admitting: Osteopathic Medicine

## 2018-05-04 VITALS — BP 138/82 | HR 105 | Temp 98.0°F | Wt 221.8 lb

## 2018-05-04 DIAGNOSIS — M62838 Other muscle spasm: Secondary | ICD-10-CM

## 2018-05-04 DIAGNOSIS — R0981 Nasal congestion: Secondary | ICD-10-CM

## 2018-05-04 DIAGNOSIS — M5412 Radiculopathy, cervical region: Secondary | ICD-10-CM

## 2018-05-04 MED ORDER — FLUTICASONE PROPIONATE 50 MCG/ACT NA SUSP: 2.0000 | Freq: Every day | NASAL | 1 refills | Status: DC

## 2018-05-04 MED ORDER — METHOCARBAMOL 500 MG PO TABS: 500.0000 mg | ORAL_TABLET | Freq: Four times a day (QID) | ORAL | 0 refills | Status: DC | PRN

## 2018-05-04 MED ORDER — MELOXICAM 15 MG PO TABS: 15.0000 mg | ORAL_TABLET | Freq: Every day | ORAL | 0 refills | Status: DC

## 2018-05-04 MED ORDER — PREDNISONE 20 MG PO TABS: 20.0000 mg | ORAL_TABLET | Freq: Two times a day (BID) | ORAL | 0 refills | Status: DC

## 2018-05-04 MED ORDER — FEXOFENADINE HCL 180 MG PO TABS: 180.0000 mg | ORAL_TABLET | Freq: Every day | ORAL | 1 refills | Status: DC

## 2018-05-04 NOTE — Progress Notes (Signed)
HPI: Adrian Mccarty is a 47 y.o. male who  has a past medical history of Erectile dysfunction, GERD (gastroesophageal reflux disease), Hyperlipidemia, and Hypertension.  he presents to [facility] today, 05/04/18,  for chief complaint of:  Neck pain, chronic, currently exacerbated Sinus/neck congestion/fullness  Neck pain: . Previous injury 2 years ago, had followed with me and with Dr. Darene Lamer for this issue.  Was improving with physical therapy so never ended up needing to get an MRI.  He is still doing base of cervical spine, bilateral equally, tingling in arms or hands, recently did notice some shooting pain down the left arm.   Sinuses: . Reports over the past couple of weeks more fullness type feeling throughout the facial sinuses, and anterior lymph nodes.  No fever, no nasal drainage, no cough, no sore throat.  Seasonal allergies, this feels similar but not quite like his usual symptoms.  Occasional ear popping.     Past medical history, surgical history, and family history reviewed.  Current medication list and allergy/intolerance information reviewed.   (See remainder of HPI, ROS, Phys Exam below)    ASSESSMENT/PLAN:   Declines referral to physical therapy.  OMT performed today to treat neck muscle spasm, patient noticed some relief with myofascial release.  We will go ahead and try steroid burst, restart meloxicam, recheck x-ray plan to follow-up with me or sports medicine for consideration of MRI if pain persists.  No enlarged lymph nodes on exam or other alarm symptoms, will trial treating for seasonal allergies and if sx persist may consider imaging/ENT referral  Neck muscle spasm - Plan: meloxicam (MOBIC) 15 MG tablet, predniSONE (DELTASONE) 20 MG tablet, methocarbamol (ROBAXIN) 500 MG tablet, DG Cervical Spine 2 or 3 views  Sinus congestion - Plan: fexofenadine (ALLEGRA ALLERGY) 180 MG tablet, fluticasone (FLONASE) 50 MCG/ACT nasal spray    Meds ordered this encounter  Medications  . meloxicam (MOBIC) 15 MG tablet    Sig: Take 1 tablet (15 mg total) by mouth daily. Daily for one week then as needed for aches/pains    Dispense:  30 tablet    Refill:  0  . predniSONE (DELTASONE) 20 MG tablet    Sig: Take 1 tablet (20 mg total) by mouth 2 (two) times daily with a meal.    Dispense:  10 tablet    Refill:  0  . methocarbamol (ROBAXIN) 500 MG tablet    Sig: Take 1 tablet (500 mg total) by mouth every 6 (six) hours as needed for muscle spasms.    Dispense:  30 tablet    Refill:  0  . fexofenadine (ALLEGRA ALLERGY) 180 MG tablet    Sig: Take 1 tablet (180 mg total) by mouth daily.    Dispense:  30 tablet    Refill:  1  . fluticasone (FLONASE) 50 MCG/ACT nasal spray    Sig: Place 2 sprays into both nostrils daily.    Dispense:  16 g    Refill:  1     Follow-up plan: Return in about 2 weeks (around 05/18/2018) for with Dr A or Dr T if neck pain persists.             ############################################ ############################################ ############################################ ############################################    Outpatient Encounter Medications as of 05/04/2018  Medication Sig  . atorvastatin (LIPITOR) 10 MG tablet Take 1 tablet (10 mg total) by mouth daily.  Marland Kitchen lisinopril-hydrochlorothiazide (PRINZIDE,ZESTORETIC) 20-25 MG tablet Take 1 tablet by mouth daily.  . sildenafil (REVATIO)  20 MG tablet 2-4 by mouth daily as needed prior to sex  . cyclobenzaprine (FLEXERIL) 10 MG tablet Take 0.5 tablets (5 mg total) by mouth at bedtime.  . fexofenadine (ALLEGRA ALLERGY) 180 MG tablet Take 1 tablet (180 mg total) by mouth daily.  . fluticasone (FLONASE) 50 MCG/ACT nasal spray Place 2 sprays into both nostrils daily.  . meloxicam (MOBIC) 15 MG tablet Take 1 tablet (15 mg total) by mouth daily. Daily for one week then as needed for aches/pains  . methocarbamol (ROBAXIN) 500 MG tablet  Take 1 tablet (500 mg total) by mouth every 6 (six) hours as needed for muscle spasms.  . predniSONE (DELTASONE) 20 MG tablet Take 1 tablet (20 mg total) by mouth 2 (two) times daily with a meal.   No facility-administered encounter medications on file as of 05/04/2018.    Allergies  Allergen Reactions  . Losartan Potassium-Hctz     Erectile Dysfunction  . Tramadol     nausea      Review of Systems:  Constitutional: No recent illness  HEENT: No  headache, no vision change, +sinus/neck fullness as per HPI  Cardiac: No  chest pain, No  pressure, No palpitations  Respiratory:  No  shortness of breath. No  Cough  Gastrointestinal: No  abdominal pain, no change on bowel habits  Musculoskeletal: +new myalgia/arthralgia  Skin: No  Rash  Hem/Onc: No  easy bruising/bleeding, No  abnormal lumps/bumps  Neurologic: No  weakness, No  Dizziness  Psychiatric: No  concerns with depression, No  concerns with anxiety  Exam:  BP 138/82 (BP Location: Left Arm, Patient Position: Sitting, Cuff Size: Normal)   Pulse (!) 105   Temp 98 F (36.7 C) (Oral)   Wt 221 lb 12.8 oz (100.6 kg)   BMI 30.93 kg/m   Constitutional: VS see above. General Appearance: alert, well-developed, well-nourished, NAD  Eyes: Normal lids and conjunctive, non-icteric sclera  Ears, Nose, Mouth, Throat: MMM, Normal external inspection ears/nares/mouth/lips/gums.  Neck: No masses, trachea midline.   Respiratory: Normal respiratory effort. no wheeze, no rhonchi, no rales  Cardiovascular: S1/S2 normal, no murmur, no rub/gallop auscultated. RRR.   Musculoskeletal: Gait normal. Symmetric and independent movement of all extremities  Paraspinal tenderness C6-7 region bilaterally, normal active range of motion to flexion/extension, rotation right to left, side bending right to left.  Neurological: Normal balance/coordination. No tremor.  Skin: warm, dry, intact.   Psychiatric: Normal judgment/insight. Normal  mood and affect. Oriented x3.   Visit summary with medication list and pertinent instructions was printed for patient to review, advised to alert [place] if any changes needed. All questions at time of visit were answered - patient instructed to contact office with any additional concerns. ER/RTC precautions were reviewed with the patient and understanding verbalized.   Follow-up plan: Return in about 2 weeks (around 05/18/2018) for with Dr A or Dr T if neck pain persists.   Please note: voice recognition software was used to produce this document, and typos may escape review. Please contact Dr. Sheppard Coil for any needed clarifications.

## 2018-05-26 ENCOUNTER — Other Ambulatory Visit: Payer: Self-pay | Admitting: Osteopathic Medicine

## 2018-05-26 DIAGNOSIS — M62838 Other muscle spasm: Secondary | ICD-10-CM

## 2018-05-26 DIAGNOSIS — R0981 Nasal congestion: Secondary | ICD-10-CM

## 2018-05-28 NOTE — Telephone Encounter (Signed)
Would call patient to confirm if this is something that he actually needs or pharmacy is just automatically sending a request

## 2018-05-28 NOTE — Telephone Encounter (Signed)
[facility] requesting med refill for meloxicam and fluticasone. As per last OV notes - "patient not taking meds". Pls advise if appropriate.

## 2018-05-30 NOTE — Telephone Encounter (Signed)
As per pt, no longer taking medications. Refill request was automatic. Pt will contact pharmacy to discontinue auto refill for both medications.

## 2018-07-02 ENCOUNTER — Other Ambulatory Visit: Payer: Self-pay | Admitting: Osteopathic Medicine

## 2018-07-02 DIAGNOSIS — R0981 Nasal congestion: Secondary | ICD-10-CM

## 2018-10-02 ENCOUNTER — Other Ambulatory Visit: Payer: Self-pay | Admitting: Osteopathic Medicine

## 2018-10-02 DIAGNOSIS — I10 Essential (primary) hypertension: Secondary | ICD-10-CM

## 2018-11-02 IMAGING — DX DG CERVICAL SPINE COMPLETE 4+V
7 series · 7 of 7 positions shown · non-contrast
Comparison: 06/19/2009.

CLINICAL DATA: Shoulder pain.  Fall.

EXAM:
CERVICAL SPINE - COMPLETE 4+ VIEW

[c-spine lat]
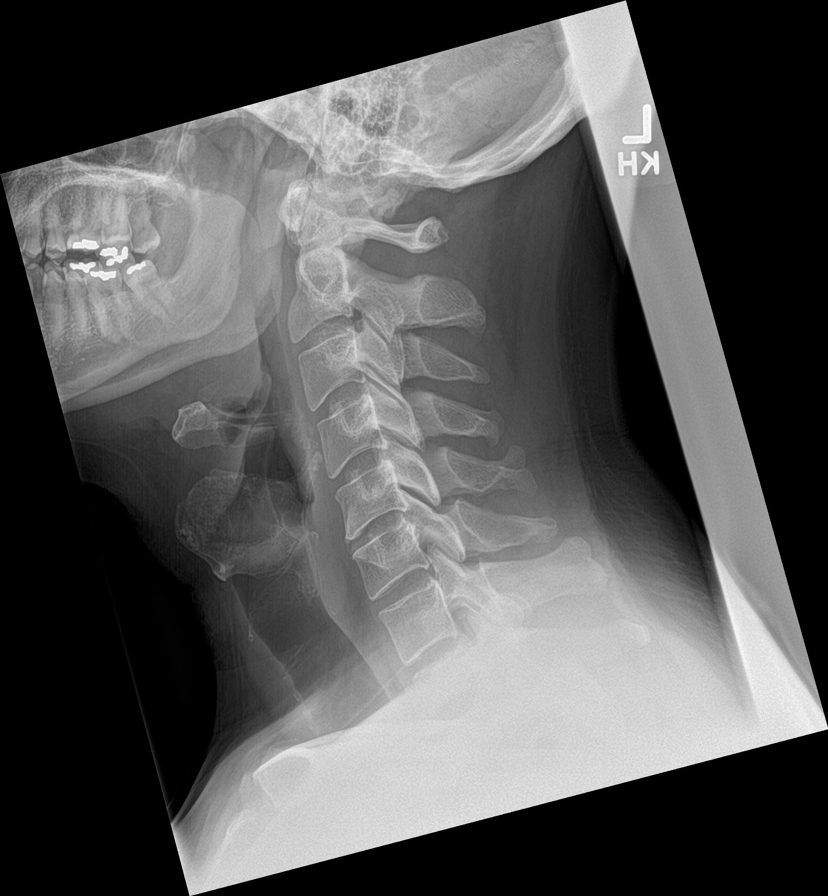

[c-spine obl (1 of 2)]
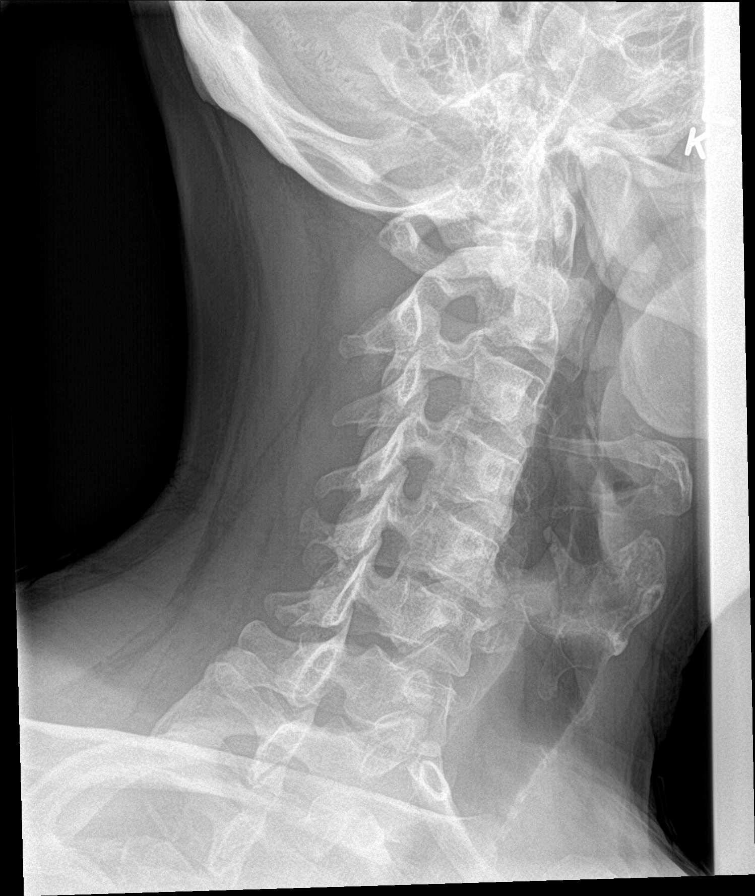

[c-spine obl (2 of 2)]
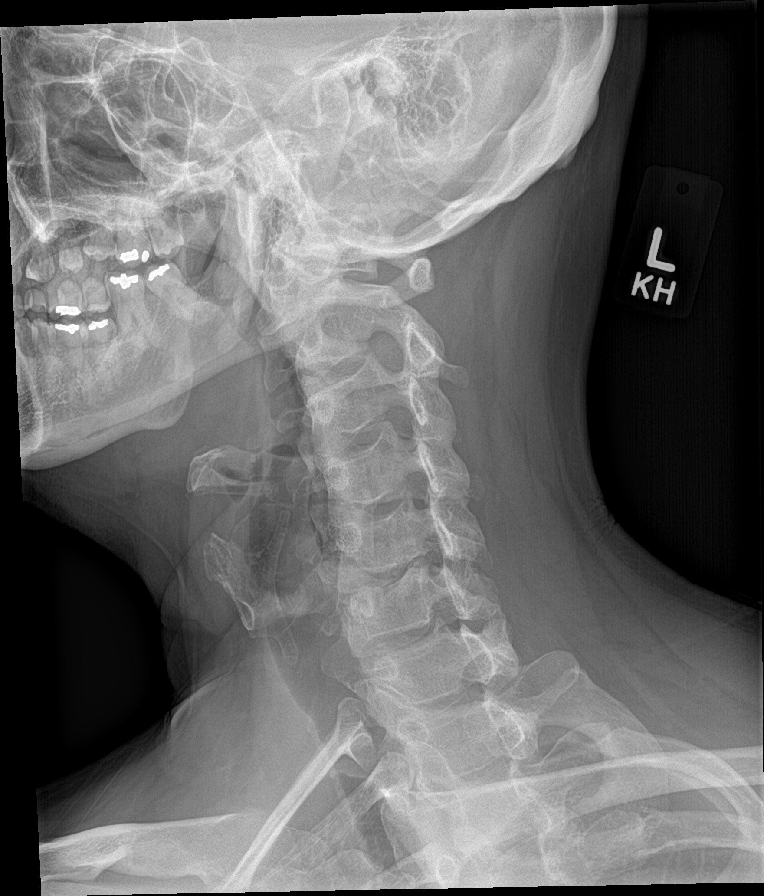

[c-spine ap]
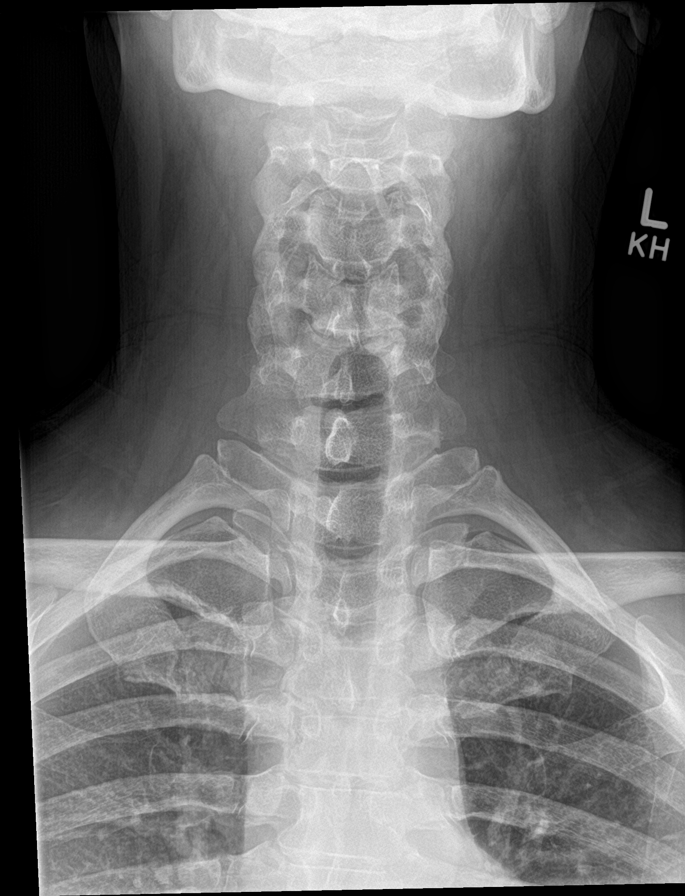

[c-spine open mouth]
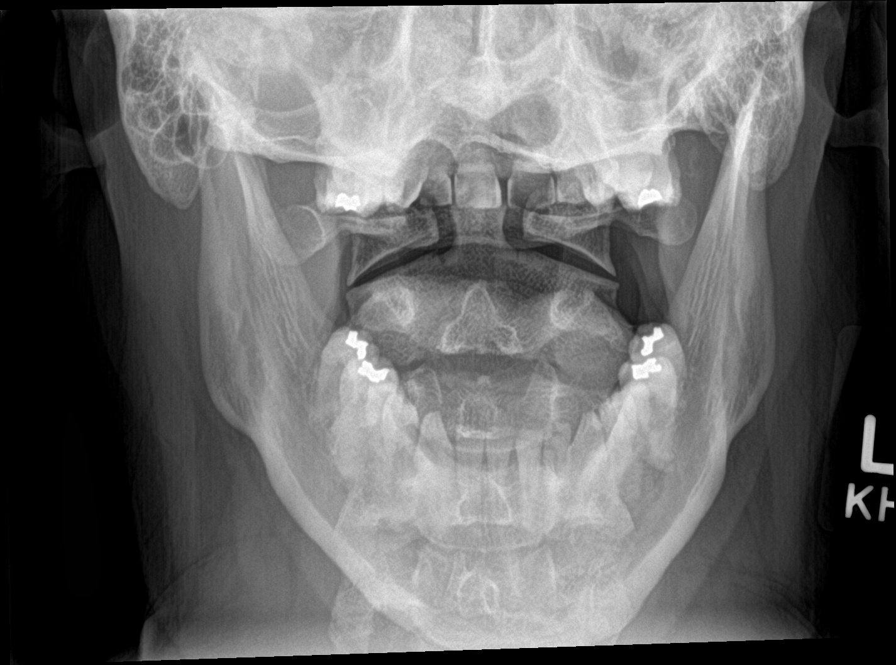

[c-spine swimmers]
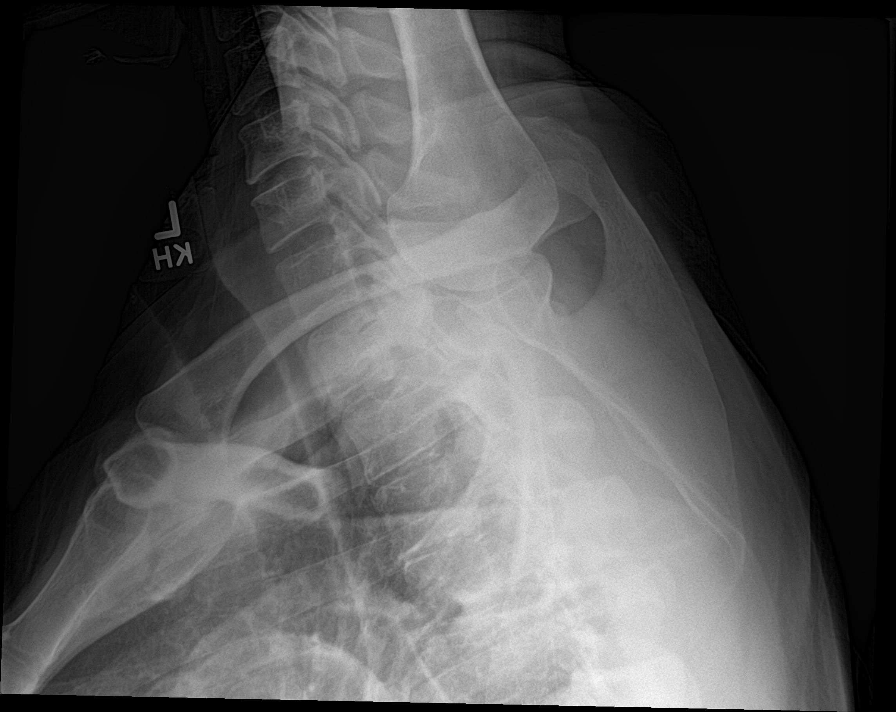

[[person_name]]
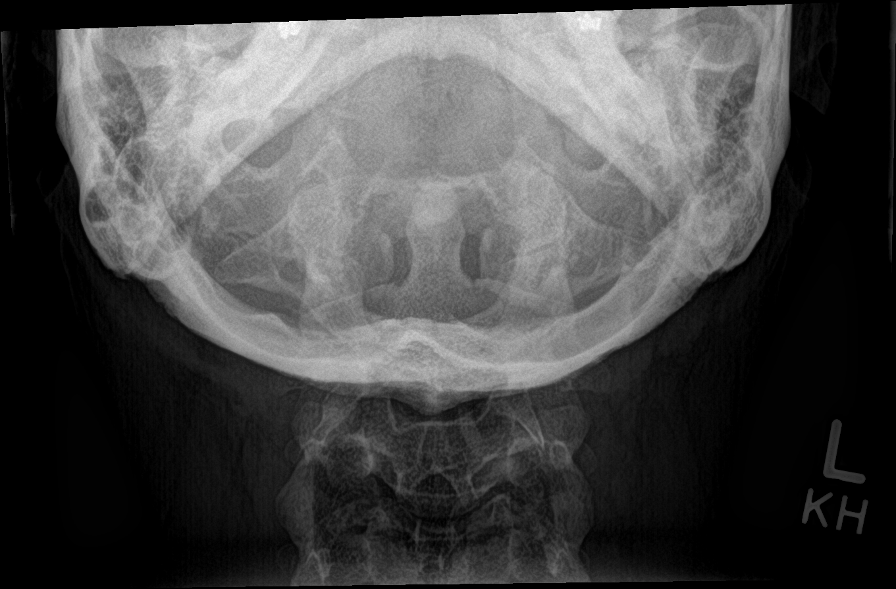

[7 of 7 positions shown; findings below may reference images not displayed]

FINDINGS: Mild degenerative change. No acute or focal bony abnormality
identified. No evidence of fracture dislocation. Pulmonary apices
are clear.
IMPRESSION: Mild degenerative change.  No acute abnormality.

## 2018-12-09 ENCOUNTER — Other Ambulatory Visit: Payer: Self-pay | Admitting: Osteopathic Medicine

## 2018-12-09 DIAGNOSIS — E785 Hyperlipidemia, unspecified: Secondary | ICD-10-CM

## 2018-12-25 ENCOUNTER — Other Ambulatory Visit: Payer: Self-pay | Admitting: Osteopathic Medicine

## 2018-12-25 DIAGNOSIS — I10 Essential (primary) hypertension: Secondary | ICD-10-CM

## 2019-01-29 ENCOUNTER — Other Ambulatory Visit: Payer: Self-pay | Admitting: Osteopathic Medicine

## 2019-01-29 DIAGNOSIS — I10 Essential (primary) hypertension: Secondary | ICD-10-CM

## 2019-01-29 NOTE — Telephone Encounter (Signed)
Please advise 

## 2019-03-02 ENCOUNTER — Other Ambulatory Visit: Payer: Self-pay | Admitting: Osteopathic Medicine

## 2019-03-02 DIAGNOSIS — I10 Essential (primary) hypertension: Secondary | ICD-10-CM

## 2019-03-02 NOTE — Telephone Encounter (Signed)
Please advise 

## 2019-03-14 ENCOUNTER — Other Ambulatory Visit: Payer: Self-pay | Admitting: Osteopathic Medicine

## 2019-03-14 DIAGNOSIS — E785 Hyperlipidemia, unspecified: Secondary | ICD-10-CM

## 2019-03-14 NOTE — Telephone Encounter (Signed)
Forwarding medication refill request to PCP for review. 

## 2019-04-01 ENCOUNTER — Other Ambulatory Visit: Payer: Self-pay | Admitting: Osteopathic Medicine

## 2019-04-01 DIAGNOSIS — I10 Essential (primary) hypertension: Secondary | ICD-10-CM

## 2019-04-01 NOTE — Telephone Encounter (Signed)
Requested medication (s) are due for refill today: yes  Requested medication (s) are on the active medication list: yes  Last refill:  03/04/2019  Future visit scheduled: no  Notes to clinic:  vm left for patient to contact office to schedule appointment    Requested Prescriptions  Pending Prescriptions Disp Refills   lisinopril-hydrochlorothiazide (ZESTORETIC) 20-25 MG tablet [Pharmacy Med Name: LISINOPRIL-HCTZ 20-25 MG TAB] 30 tablet 0    Sig: TAKE 1 TABLET BY MOUTH DAILY. APPT FOR FURTHER REFILLS     Cardiovascular:  ACEI + Diuretic Combos Failed - 04/01/2019  8:33 AM      Failed - Na in normal range and within 180 days    Sodium  Date Value Ref Range Status  03/02/2018 136 135 - 146 mmol/L Final         Failed - K in normal range and within 180 days    Potassium  Date Value Ref Range Status  03/02/2018 4.5 3.5 - 5.3 mmol/L Final         Failed - Cr in normal range and within 180 days    Creat  Date Value Ref Range Status  03/02/2018 0.92 0.60 - 1.35 mg/dL Final         Failed - Ca in normal range and within 180 days    Calcium  Date Value Ref Range Status  03/02/2018 10.0 8.6 - 10.3 mg/dL Final         Failed - Valid encounter within last 6 months    Recent Outpatient Visits          11 months ago Neck muscle spasm   [facility] [place], Lanelle Bal, DO   1 year ago Annual physical exam   [facility], Lanelle Bal, DO   2 years ago Left cervical radiculopathy   [place] [place] [facility], Gwen Her, MD   2 years ago Essential hypertension   [facility] [place], Lanelle Bal, DO   3 years ago [place], [place], [place] - Patient is not pregnant      Passed - Last BP in normal range    BP Readings from Last 1 Encounters:  05/04/18  138/82

## 2019-04-15 ENCOUNTER — Other Ambulatory Visit: Payer: Self-pay | Admitting: Osteopathic Medicine

## 2019-04-15 ENCOUNTER — Encounter: Payer: Self-pay | Admitting: Osteopathic Medicine

## 2019-04-15 ENCOUNTER — Telehealth: Payer: Self-pay | Admitting: Osteopathic Medicine

## 2019-04-15 DIAGNOSIS — I10 Essential (primary) hypertension: Secondary | ICD-10-CM

## 2019-04-15 DIAGNOSIS — Z0000 Encounter for general adult medical examination without abnormal findings: Secondary | ICD-10-CM

## 2019-04-15 DIAGNOSIS — E785 Hyperlipidemia, unspecified: Secondary | ICD-10-CM

## 2019-04-15 NOTE — Telephone Encounter (Signed)
Pt called and scheduled his CPE for October and wants his labs before his appt time so can you enter labs for this and also refill his meds? Call pt and let him know

## 2019-04-15 NOTE — Telephone Encounter (Signed)
Pt scheduled OV. Sending Rx to last until appt.

## 2019-04-15 NOTE — Telephone Encounter (Signed)
Requested medication (s) are due for refill today: yes  Requested medication (s) are on the active medication list: yes  Last refill:  04/01/2019  Future visit scheduled: no  Notes to clinic:  Review for refill   Requested Prescriptions  Pending Prescriptions Disp Refills   lisinopril-hydrochlorothiazide (ZESTORETIC) 20-25 MG tablet [Pharmacy Med Name: LISINOPRIL-HCTZ 20-25 MG TAB] 15 tablet 0    Sig: TAKE 1 TABLET BY MOUTH DAILY. APPT FOR FURTHER REFILLS     Cardiovascular:  ACEI + Diuretic Combos Failed - 04/15/2019  1:31 PM      Failed - Na in normal range and within 180 days    Sodium  Date Value Ref Range Status  03/02/2018 136 135 - 146 mmol/L Final         Failed - K in normal range and within 180 days    Potassium  Date Value Ref Range Status  03/02/2018 4.5 3.5 - 5.3 mmol/L Final         Failed - Cr in normal range and within 180 days    Creat  Date Value Ref Range Status  03/02/2018 0.92 0.60 - 1.35 mg/dL Final         Failed - Ca in normal range and within 180 days    Calcium  Date Value Ref Range Status  03/02/2018 10.0 8.6 - 10.3 mg/dL Final         Failed - Valid encounter within last 6 months    Recent Outpatient Visits          11 months ago Neck muscle spasm   [place] [facility] [facility] [place], Lanelle Bal, DO   1 year ago Annual physical exam   [place] [facility], Lanelle Bal, DO   2 years ago Left cervical radiculopathy   [facility], Gwen Her, MD   2 years ago Essential hypertension   [facility] [place], Lanelle Bal, DO   3 years ago [place], [place], [place] - Patient is not pregnant      Passed - Last BP in normal range    BP Readings from Last 1 Encounters:  05/04/18 138/82

## 2019-04-15 NOTE — Telephone Encounter (Signed)
Tasks was completed by another team member. Pt has been updated via [employer].

## 2019-04-26 LAB — LIPID PANEL
Cholesterol: 203 mg/dL — ABNORMAL HIGH (ref <200)
HDL: 55 mg/dL (ref >=40)
LDL Cholesterol (Calc): 126 mg/dL (calc) — ABNORMAL HIGH
Non-HDL Cholesterol (Calc): 148 mg/dL (calc) — ABNORMAL HIGH (ref <130)
Total CHOL/HDL Ratio: 3.7 (calc) (ref <5.0)
Triglycerides: 110 mg/dL (ref <150)

## 2019-04-26 LAB — CBC
HCT: 41.5 % (ref 38.5–50.0)
Hemoglobin: 14.3 g/dL (ref 13.2–17.1)
MCH: 31.4 pg (ref 27.0–33.0)
MCHC: 34.5 g/dL (ref 32.0–36.0)
MCV: 91.2 fL (ref 80.0–100.0)
MPV: 9.8 fL (ref 7.5–12.5)
Platelets: 257 10*3/uL (ref 140–400)
RBC: 4.55 10*6/uL (ref 4.20–5.80)
RDW: 11.7 % (ref 11.0–15.0)
WBC: 5.6 10*3/uL (ref 3.8–10.8)

## 2019-04-26 LAB — COMPLETE METABOLIC PANEL WITH GFR
AG Ratio: 1.8 (calc) (ref 1.0–2.5)
ALT: 41 U/L (ref 9–46)
AST: 28 U/L (ref 10–40)
Albumin: 4.7 g/dL (ref 3.6–5.1)
Alkaline phosphatase (APISO): 54 U/L (ref 36–130)
BUN: 19 mg/dL (ref 7–25)
CO2: 28 mmol/L (ref 20–32)
Calcium: 10.1 mg/dL (ref 8.6–10.3)
Chloride: 101 mmol/L (ref 98–110)
Creat: 0.96 mg/dL (ref 0.60–1.35)
GFR, Est African American: 107 mL/min/{1.73_m2} (ref >=60)
GFR, Est Non African American: 92 mL/min/{1.73_m2} (ref >=60)
Globulin: 2.6 g/dL (calc) (ref 1.9–3.7)
Glucose, Bld: 104 mg/dL — ABNORMAL HIGH (ref 65–99)
Potassium: 4.3 mmol/L (ref 3.5–5.3)
Sodium: 138 mmol/L (ref 135–146)
Total Bilirubin: 0.6 mg/dL (ref 0.2–1.2)
Total Protein: 7.3 g/dL (ref 6.1–8.1)

## 2019-04-26 LAB — HEMOGLOBIN A1C W/OUT EAG: Hgb A1c MFr Bld: 5.1 % of total Hgb (ref <5.7)

## 2019-04-30 ENCOUNTER — Other Ambulatory Visit: Payer: Self-pay | Admitting: Osteopathic Medicine

## 2019-04-30 DIAGNOSIS — I10 Essential (primary) hypertension: Secondary | ICD-10-CM

## 2019-05-15 ENCOUNTER — Encounter: Payer: Managed Care, Other (non HMO) | Admitting: Osteopathic Medicine

## 2019-05-17 ENCOUNTER — Other Ambulatory Visit: Payer: Self-pay | Admitting: Osteopathic Medicine

## 2019-05-17 DIAGNOSIS — I10 Essential (primary) hypertension: Secondary | ICD-10-CM

## 2019-05-17 NOTE — Telephone Encounter (Signed)
Requested medication (s) are due for refill today: yes  Requested medication (s) are on the active medication list: yes  Last refill:  04/30/2019  Future visit scheduled:NO  Notes to clinic: Review for refill Overdue for office visit    Requested Prescriptions  Pending Prescriptions Disp Refills   lisinopril-hydrochlorothiazide (ZESTORETIC) 20-25 MG tablet [Pharmacy Med Name: LISINOPRIL-HCTZ 20-25 MG TAB] 15 tablet 0    Sig: TAKE 1 TABLET BY MOUTH DAILY. APPT FOR FURTHER REFILLS     Cardiovascular:  ACEI + Diuretic Combos Failed - 05/17/2019  8:34 AM      Failed - Valid encounter within last 6 months    Recent Outpatient Visits          1 year ago Neck muscle spasm   [facility] [place], Lanelle Bal, DO   1 year ago Annual physical exam   [facility] [facility], Lanelle Bal, DO   2 years ago Left cervical radiculopathy   [place] [facility], Gwen Her, MD   2 years ago Essential hypertension   [place] [facility], Lanelle Bal, DO   3 years ago [place] [facility], [place], DO             Passed - Na in normal range and within 180 days    Sodium  Date Value Ref Range Status  04/19/2019 138 135 - 146 mmol/L Final         Passed - K in normal range and within 180 days    Potassium  Date Value Ref Range Status  04/19/2019 4.3 3.5 - 5.3 mmol/L Final         Passed - Cr in normal range and within 180 days    Creat  Date Value Ref Range Status  04/19/2019 0.96 0.60 - 1.35 mg/dL Final         Passed - Ca in normal range and within 180 days    Calcium  Date Value Ref Range Status  04/19/2019 10.1 8.6 - 10.3 mg/dL Final         Passed - Patient is not pregnant      Passed - Last BP in normal range    BP Readings from Last 1 Encounters:  05/04/18 138/82

## 2019-05-29 ENCOUNTER — Telehealth: Payer: Self-pay | Admitting: Osteopathic Medicine

## 2019-05-29 ENCOUNTER — Encounter: Payer: Self-pay | Admitting: Osteopathic Medicine

## 2019-05-29 DIAGNOSIS — I10 Essential (primary) hypertension: Secondary | ICD-10-CM

## 2019-05-29 MED ORDER — LISINOPRIL-HYDROCHLOROTHIAZIDE 20-25 MG PO TABS: 1.0000 | ORAL_TABLET | Freq: Every day | ORAL | 0 refills | Status: DC

## 2019-05-29 NOTE — Telephone Encounter (Signed)
Med refilled has appointment in January. KG LPN

## 2019-07-24 ENCOUNTER — Other Ambulatory Visit: Payer: Self-pay | Admitting: Osteopathic Medicine

## 2019-07-24 DIAGNOSIS — I10 Essential (primary) hypertension: Secondary | ICD-10-CM

## 2019-07-24 NOTE — Telephone Encounter (Signed)
Requested medication (s) are due for refill today: yes  Requested medication (s) are on the active medication list:yes  Last refill:  05/29/2019  Future visit scheduled: no  Notes to clinic:  review for refill   Requested Prescriptions  Pending Prescriptions Disp Refills   lisinopril-hydrochlorothiazide (ZESTORETIC) 20-25 MG tablet [Pharmacy Med Name: LISINOPRIL-HCTZ 20-25 MG TAB] 60 tablet 0    Sig: Take 1 tablet by mouth daily. APPT FOR FURTHER REFILLS      Cardiovascular:  ACEI + Diuretic Combos Failed - 07/24/2019  9:33 AM      Failed - Valid encounter within last 6 months    Recent Outpatient Visits           1 year ago Neck muscle spasm   [facility], Lanelle Bal, DO   1 year ago Annual physical exam   [place] [facility], Lanelle Bal, DO   2 years ago Left cervical radiculopathy   [place] [facility], Gwen Her, MD   2 years ago Essential hypertension   [place] [facility], Lanelle Bal, DO   3 years ago [place] [facility], [place], DO              Passed - Na in normal range and within 180 days    Sodium  Date Value Ref Range Status  04/19/2019 138 135 - 146 mmol/L Final          Passed - K in normal range and within 180 days    Potassium  Date Value Ref Range Status  04/19/2019 4.3 3.5 - 5.3 mmol/L Final          Passed - Cr in normal range and within 180 days    Creat  Date Value Ref Range Status  04/19/2019 0.96 0.60 - 1.35 mg/dL Final          Passed - Ca in normal range and within 180 days    Calcium  Date Value Ref Range Status  04/19/2019 10.1 8.6 - 10.3 mg/dL Final          Passed - Patient is not pregnant      Passed - Last BP in normal range    BP Readings from Last 1 Encounters:  05/04/18 138/82

## 2019-08-23 ENCOUNTER — Other Ambulatory Visit: Payer: Self-pay | Admitting: Osteopathic Medicine

## 2019-08-23 DIAGNOSIS — I10 Essential (primary) hypertension: Secondary | ICD-10-CM

## 2019-09-02 ENCOUNTER — Encounter: Payer: Self-pay | Admitting: Osteopathic Medicine

## 2019-09-02 DIAGNOSIS — I10 Essential (primary) hypertension: Secondary | ICD-10-CM

## 2019-09-02 MED ORDER — LISINOPRIL-HYDROCHLOROTHIAZIDE 20-25 MG PO TABS: 1.0000 | ORAL_TABLET | Freq: Every day | ORAL | 0 refills | Status: DC

## 2019-09-06 ENCOUNTER — Ambulatory Visit (INDEPENDENT_AMBULATORY_CARE_PROVIDER_SITE_OTHER): Payer: Managed Care, Other (non HMO) | Admitting: Nurse Practitioner

## 2019-09-06 ENCOUNTER — Encounter: Payer: Self-pay | Admitting: Nurse Practitioner

## 2019-09-06 ENCOUNTER — Other Ambulatory Visit: Payer: Self-pay

## 2019-09-06 VITALS — BP 163/104 | HR 79 | Temp 98.0°F | Ht 71.0 in | Wt 226.0 lb

## 2019-09-06 DIAGNOSIS — B078 Other viral warts: Secondary | ICD-10-CM

## 2019-09-06 DIAGNOSIS — E785 Hyperlipidemia, unspecified: Secondary | ICD-10-CM | POA: Diagnosis not present

## 2019-09-06 DIAGNOSIS — I10 Essential (primary) hypertension: Secondary | ICD-10-CM

## 2019-09-06 MED ORDER — ATORVASTATIN CALCIUM 10 MG PO TABS: 10.0000 mg | ORAL_TABLET | Freq: Every day | ORAL | 3 refills | Status: DC

## 2019-09-06 MED ORDER — LISINOPRIL-HYDROCHLOROTHIAZIDE 20-25 MG PO TABS: 1.0000 | ORAL_TABLET | Freq: Every day | ORAL | 3 refills | Status: DC

## 2019-09-06 NOTE — Patient Instructions (Signed)
DASH Eating Plan DASH stands for "Dietary Approaches to Stop Hypertension." The DASH eating plan is a healthy eating plan that has been shown to reduce high blood pressure (hypertension). It may also reduce your risk for type 2 diabetes, heart disease, and stroke. The DASH eating plan may also help with weight loss. What are tips for following this plan?  General guidelines  Avoid eating more than 2,300 mg (milligrams) of salt (sodium) a day. If you have hypertension, you may need to reduce your sodium intake to 1,500 mg a day.  Limit alcohol intake to no more than 1 drink a day for nonpregnant women and 2 drinks a day for men. One drink equals 12 oz of beer, 5 oz of wine, or 1 oz of hard liquor.  Work with your health care provider to maintain a healthy body weight or to lose weight. Ask what an ideal weight is for you.  Get at least 30 minutes of exercise that causes your heart to beat faster (aerobic exercise) most days of the week. Activities may include walking, swimming, or biking.  Work with your health care provider or diet and nutrition specialist (dietitian) to adjust your eating plan to your individual calorie needs. Reading food labels   Check food labels for the amount of sodium per serving. Choose foods with less than 5 percent of the Daily Value of sodium. Generally, foods with less than 300 mg of sodium per serving fit into this eating plan.  To find whole grains, look for the word "whole" as the first word in the ingredient list. Shopping  Buy products labeled as "low-sodium" or "no salt added."  Buy fresh foods. Avoid canned foods and premade or frozen meals. Cooking  Avoid adding salt when cooking. Use salt-free seasonings or herbs instead of table salt or sea salt. Check with your health care provider or pharmacist before using salt substitutes.  Do not fry foods. Cook foods using healthy methods such as baking, boiling, grilling, and broiling instead.  Cook with  heart-healthy oils, such as olive, canola, soybean, or sunflower oil. Meal planning  Eat a balanced diet that includes: ? 5 or more servings of fruits and vegetables each day. At each meal, try to fill half of your plate with fruits and vegetables. ? Up to 6-8 servings of whole grains each day. ? Less than 6 oz of lean meat, poultry, or fish each day. A 3-oz serving of meat is about the same size as a deck of cards. One egg equals 1 oz. ? 2 servings of low-fat dairy each day. ? A serving of nuts, seeds, or beans 5 times each week. ? Heart-healthy fats. Healthy fats called Omega-3 fatty acids are found in foods such as flaxseeds and coldwater fish, like sardines, salmon, and mackerel.  Limit how much you eat of the following: ? Canned or prepackaged foods. ? Food that is high in trans fat, such as fried foods. ? Food that is high in saturated fat, such as fatty meat. ? Sweets, desserts, sugary drinks, and other foods with added sugar. ? Full-fat dairy products.  Do not salt foods before eating.  Try to eat at least 2 vegetarian meals each week.  Eat more home-cooked food and less restaurant, buffet, and fast food.  When eating at a restaurant, ask that your food be prepared with less salt or no salt, if possible. What foods are recommended? The items listed may not be a complete list. Talk with your dietitian about   what dietary choices are best for you. Grains Whole-grain or whole-wheat bread. Whole-grain or whole-wheat pasta. Brown rice. Oatmeal. Quinoa. Bulgur. Whole-grain and low-sodium cereals. Pita bread. Low-fat, low-sodium crackers. Whole-wheat flour tortillas. Vegetables Fresh or frozen vegetables (raw, steamed, roasted, or grilled). Low-sodium or reduced-sodium tomato and vegetable juice. Low-sodium or reduced-sodium tomato sauce and tomato paste. Low-sodium or reduced-sodium canned vegetables. Fruits All fresh, dried, or frozen fruit. Canned fruit in natural juice (without  added sugar). Meat and other protein foods Skinless chicken or turkey. Ground chicken or turkey. Pork with fat trimmed off. Fish and seafood. Egg whites. Dried beans, peas, or lentils. Unsalted nuts, nut butters, and seeds. Unsalted canned beans. Lean cuts of beef with fat trimmed off. Low-sodium, lean deli meat. Dairy Low-fat (1%) or fat-free (skim) milk. Fat-free, low-fat, or reduced-fat cheeses. Nonfat, low-sodium ricotta or cottage cheese. Low-fat or nonfat yogurt. Low-fat, low-sodium cheese. Fats and oils Soft margarine without trans fats. Vegetable oil. Low-fat, reduced-fat, or light mayonnaise and salad dressings (reduced-sodium). Canola, safflower, olive, soybean, and sunflower oils. Avocado. Seasoning and other foods Herbs. Spices. Seasoning mixes without salt. Unsalted popcorn and pretzels. Fat-free sweets. What foods are not recommended? The items listed may not be a complete list. Talk with your dietitian about what dietary choices are best for you. Grains Baked goods made with fat, such as croissants, muffins, or some breads. Dry pasta or rice meal packs. Vegetables Creamed or fried vegetables. Vegetables in a cheese sauce. Regular canned vegetables (not low-sodium or reduced-sodium). Regular canned tomato sauce and paste (not low-sodium or reduced-sodium). Regular tomato and vegetable juice (not low-sodium or reduced-sodium). Pickles. Olives. Fruits Canned fruit in a light or heavy syrup. Fried fruit. Fruit in cream or butter sauce. Meat and other protein foods Fatty cuts of meat. Ribs. Fried meat. Bacon. Sausage. Bologna and other processed lunch meats. Salami. Fatback. Hotdogs. Bratwurst. Salted nuts and seeds. Canned beans with added salt. Canned or smoked fish. Whole eggs or egg yolks. Chicken or turkey with skin. Dairy Whole or 2% milk, cream, and half-and-half. Whole or full-fat cream cheese. Whole-fat or sweetened yogurt. Full-fat cheese. Nondairy creamers. Whipped toppings.  Processed cheese and cheese spreads. Fats and oils Butter. Stick margarine. Lard. Shortening. Ghee. Bacon fat. Tropical oils, such as coconut, palm kernel, or palm oil. Seasoning and other foods Salted popcorn and pretzels. Onion salt, garlic salt, seasoned salt, table salt, and sea salt. Worcestershire sauce. Tartar sauce. Barbecue sauce. Teriyaki sauce. Soy sauce, including reduced-sodium. Steak sauce. Canned and packaged gravies. Fish sauce. Oyster sauce. Cocktail sauce. Horseradish that you find on the shelf. Ketchup. Mustard. Meat flavorings and tenderizers. Bouillon cubes. Hot sauce and Tabasco sauce. Premade or packaged marinades. Premade or packaged taco seasonings. Relishes. Regular salad dressings. Where to find more information:  National Heart, Lung, and Blood Institute: www.nhlbi.nih.gov  American Heart Association: www.heart.org Summary  The DASH eating plan is a healthy eating plan that has been shown to reduce high blood pressure (hypertension). It may also reduce your risk for type 2 diabetes, heart disease, and stroke.  With the DASH eating plan, you should limit salt (sodium) intake to 2,300 mg a day. If you have hypertension, you may need to reduce your sodium intake to 1,500 mg a day.  When on the DASH eating plan, aim to eat more fresh fruits and vegetables, whole grains, lean proteins, low-fat dairy, and heart-healthy fats.  Work with your health care provider or diet and nutrition specialist (dietitian) to adjust your eating plan to your   individual calorie needs. This information is not intended to replace advice given to you by your health care provider. Make sure you discuss any questions you have with your health care provider. Document Revised: 06/16/2017 Document Reviewed: 06/27/2016 Elsevier Patient Education  2020 [employer]  

## 2019-09-06 NOTE — Progress Notes (Signed)
Established Patient Office Visit  Subjective:  Patient ID: Adrian Mccarty, male    DOB: 03-17-1971  Age: 49 y.o. MRN: [id number]  CC:  Chief Complaint  Patient presents with  . Hypertension  . Hyperlipidemia  . Skin Lesion    HPI Adrian Mccarty presents for follow-up for hypertension and hyperlipidemia. He would also like to discuss the removal of a skin lesion on his right wrist.   HTN HYPERTENSION Hypertension status: controlled  Satisfied with current treatment? yes Duration of hypertension: chronic BP monitoring frequency:  weekly BP range: 120-130's/70-80's BP medication side effects:  no Medication compliance: good compliance Aspirin: no Recurrent headaches: no Visual changes: no Palpitations: no Dyspnea: no Chest pain: no Lower extremity edema: no Dizzy/lightheaded: no  Patient does monitor his blood pressure at home and reports average pressures in the 120's-130's/70's-80's, with the majority on the lower end. He reports that it is typically high when he comes into the office and has been that way for years. Patient denies any side effects of medication and reports taking it daily. He is requesting refills today.   HLD HYPERLIPIDEMIA Hyperlipidemia status: good compliance Satisfied with current treatment?  yes Side effects:  no Medication compliance: good compliance Supplements: none Aspirin:  no The 10-year ASCVD risk score Mikey Bussing DC Jr., et al., 2013) is: 5.2%   Values used to calculate the score:     Age: 68 years     Sex: Male     Is Non-Hispanic African American: No     Diabetic: No     Tobacco smoker: No     Systolic Blood Pressure: [id number] mmHg     Is BP treated: Yes     HDL Cholesterol: 55 mg/dL     Total Cholesterol: 203 mg/dL Chest pain:  no Coronary artery disease:  no Family history CAD:  yes Family history Irisa Grimsley CAD:  yes   He reports he has not been exercising and watching his diet as well as he has in the past, most notably since [place] hit and his  gym closed. He has recently started back at the gym and is beginning to follow an exercise regimen again. He does have a significant family history of heart disease on both maternal and paternal sides with his mother having undergone cardiac stenting and his father triple bypass surgery last year.   SKIN LESION Duration: months Location: right medial wrist Painful: no Itching: no Onset: gradual Context: not changing Associated signs and symptoms: none History of skin cancer: no History of precancerous skin lesions: no Family history of skin cancer: no  He reports he has had this lesion removed once before in the distant past, but it has now returned. It is not causing any discomfort at this time.   Past Medical History:  Diagnosis Date  . Erectile dysfunction   . GERD (gastroesophageal reflux disease)   . Hyperlipidemia   . Hypertension     Past Surgical History:  Procedure Laterality Date  . VASECTOMY      Family History  Problem Relation Age of Onset  . Heart disease Mother   . Heart attack Mother        age: late 48's  . Hypertension Father   . Heart disease Maternal Grandfather   . Heart disease Paternal Grandfather     Social History   Socioeconomic History  . Marital status: Married    Spouse name: Not on file  . Number of children: Not on file  . Years of  education: Not on file  . Highest education level: Not on file  Occupational History  . Not on file  Tobacco Use  . Smoking status: Former Smoker    Quit date: 1994    Years since quitting: 27.1  . Smokeless tobacco: Former Systems developer    Types: Chew  Substance and Sexual Activity  . Alcohol use: Yes    Alcohol/week: 6.0 standard drinks    Types: 6 Cans of beer per week  . Drug use: No  . Sexual activity: Yes    Birth control/protection: Surgical    Comment: s/p vasectomy   Other Topics Concern  . Not on file  Social History Narrative  . Not on file   Social Determinants of Health   Financial  Resource Strain:   . Difficulty of Paying Living Expenses: Not on file  Food Insecurity:   . Worried About Charity fundraiser in the Last Year: Not on file  . Ran Out of Food in the Last Year: Not on file  Transportation Needs:   . Lack of Transportation (Medical): Not on file  . Lack of Transportation (Non-Medical): Not on file  Physical Activity:   . Days of Exercise per Week: Not on file  . Minutes of Exercise per Session: Not on file  Stress:   . Feeling of Stress : Not on file  Social Connections:   . Frequency of Communication with Friends and Family: Not on file  . Frequency of Social Gatherings with Friends and Family: Not on file  . Attends Religious Services: Not on file  . Active Member of Clubs or Organizations: Not on file  . Attends Archivist Meetings: Not on file  . Marital Status: Not on file  Intimate Partner Violence:   . Fear of Current or Ex-Partner: Not on file  . Emotionally Abused: Not on file  . Physically Abused: Not on file  . Sexually Abused: Not on file    Outpatient Medications Prior to Visit  Medication Sig Dispense Refill  . fexofenadine (ALLEGRA) 180 MG tablet TAKE 1 TABLET BY MOUTH EVERY DAY 30 tablet 1  . atorvastatin (LIPITOR) 10 MG tablet TAKE 1 TABLET (10 MG TOTAL) BY MOUTH DAILY. WILL BE DUE FOR LABS/APPT WHEN RX RUNS OUT 90 tablet 3  . lisinopril-hydrochlorothiazide (ZESTORETIC) 20-25 MG tablet Take 1 tablet by mouth daily. 7 tablet 0  . cyclobenzaprine (FLEXERIL) 10 MG tablet Take 0.5 tablets (5 mg total) by mouth at bedtime. 30 tablet 0  . meloxicam (MOBIC) 15 MG tablet Take 1 tablet (15 mg total) by mouth daily. Daily for one week then as needed for aches/pains (Patient not taking: Reported on 09/06/2019) 30 tablet 0  . methocarbamol (ROBAXIN) 500 MG tablet Take 1 tablet (500 mg total) by mouth every 6 (six) hours as needed for muscle spasms. (Patient not taking: Reported on 09/06/2019) 30 tablet 0  . predniSONE (DELTASONE) 20 MG  tablet Take 1 tablet (20 mg total) by mouth 2 (two) times daily with a meal. (Patient not taking: Reported on 09/06/2019) 10 tablet 0  . sildenafil (REVATIO) 20 MG tablet TAKE 2 TO 4 TABLETS BY MOUTH AS NEEDED PRIOR TO SEX (Patient not taking: Reported on 09/06/2019) 50 tablet 1  . fluticasone (FLONASE) 50 MCG/ACT nasal spray Place 2 sprays into both nostrils daily. 16 g 1   No facility-administered medications prior to visit.    Allergies  Allergen Reactions  . Losartan Potassium-Hctz     Erectile Dysfunction  ROS Review of Systems  Constitutional: Negative for chills, fatigue and fever.  Respiratory: Negative for cough, chest tightness and shortness of breath.   Cardiovascular: Negative for chest pain, palpitations and leg swelling.  Gastrointestinal: Negative for abdominal distention and abdominal pain.  Genitourinary: Negative for difficulty urinating and hematuria.  Musculoskeletal: Negative for arthralgias and myalgias.  Skin: Positive for color change.  Neurological: Negative for dizziness, weakness, light-headedness and headaches.  Psychiatric/Behavioral: Negative for confusion. The patient is not nervous/anxious.       Objective:    Physical Exam  Constitutional: He is oriented to person, place, and time. He appears well-developed and well-nourished.  HENT:  Head: Normocephalic.  Neck: No JVD present.  Cardiovascular: Normal rate, regular rhythm, S1 normal, S2 normal, normal heart sounds and intact distal pulses. PMI is not displaced. Exam reveals no friction rub.  No murmur heard. Pulmonary/Chest: Breath sounds normal. No respiratory distress. He has no wheezes. He exhibits no tenderness.  Abdominal: Soft. Bowel sounds are normal. He exhibits no distension. There is no abdominal tenderness.  Musculoskeletal:        General: No edema. Normal range of motion.     Cervical back: Normal range of motion.  Neurological: He is alert and oriented to person, place, and time.  He has normal reflexes. Coordination normal.  Skin: Skin is warm and dry.     Psychiatric: He has a normal mood and affect. His behavior is normal. Thought content normal.  Nursing note and vitals reviewed.   BP (!) 163/104   Pulse 79   Temp 98 F (36.7 C) (Oral)   Ht 5\' 11"  (1.803 m)   Wt 226 lb (102.5 kg)   SpO2 99%   BMI 31.52 kg/m  Wt Readings from Last 3 Encounters:  09/06/19 226 lb (102.5 kg)  05/04/18 221 lb 12.8 oz (100.6 kg)  09/18/17 208 lb 1.9 oz (94.4 kg)     Health Maintenance Due  Topic Date Due  . HIV Screening  02/26/1985    There are no preventive care reminders to display for this patient.  Lab Results  Component Value Date   TSH 3.80 08/17/2016   Lab Results  Component Value Date   WBC 5.6 04/19/2019   HGB 14.3 04/19/2019   HCT 41.5 04/19/2019   MCV 91.2 04/19/2019   PLT 257 04/19/2019   Lab Results  Component Value Date   NA 138 04/19/2019   K 4.3 04/19/2019   CO2 28 04/19/2019   GLUCOSE 104 (H) 04/19/2019   BUN 19 04/19/2019   CREATININE 0.96 04/19/2019   BILITOT 0.6 04/19/2019   ALKPHOS 56 08/17/2016   AST 28 04/19/2019   ALT 41 04/19/2019   PROT 7.3 04/19/2019   ALBUMIN 4.9 08/17/2016   CALCIUM 10.1 04/19/2019   Lab Results  Component Value Date   CHOL 203 (H) 04/19/2019   Lab Results  Component Value Date   HDL 55 04/19/2019   Lab Results  Component Value Date   LDLCALC 126 (H) 04/19/2019   Lab Results  Component Value Date   TRIG 110 04/19/2019   Lab Results  Component Value Date   CHOLHDL 3.7 04/19/2019   Lab Results  Component Value Date   HGBA1C 5.1 04/19/2019      Assessment & Plan:   1. Essential hypertension BP elevated in the office today- he reports this is normal, however, his recorded blood pressures from home are within the expected ranges from 120-130's (low) /70's and 80's (low).  He does experience white coat syndrome, therefore, I am not necessarily concerned about his elevated BP in the  office today.  Instructed patient to follow DASH diet, attempt regular exercise, avoid excessive alcohol consumption, continue to monitor his BP at home regularly. Medication refilled today for 1 year.  Patient to follow-up - preferably in October of this year- for labs and monitoring. If his BP becomes elevated at home, instructed patient to notify the office for evaluation.  - lisinopril-hydrochlorothiazide (ZESTORETIC) 20-25 MG tablet; Take 1 tablet by mouth daily.  Dispense: 90 tablet; Refill: 3  2. Hyperlipidemia, unspecified hyperlipidemia type Patient appears to be fairly well controlled on his current regimen. There is room for improvement, but I will allow him to work on getting back into his diet and exercise routine prior to changing his medications.  Instructed patient to follow the DASH diet and work on his exercise regimen.  Medication refilled for 1 year.  Patient to follow-up - preferably in October of this year- for labs and monitoring. Or sooner, if needed.  - atorvastatin (LIPITOR) 10 MG tablet; Take 1 tablet (10 mg total) by mouth daily.  Dispense: 90 tablet; Refill: 3  3. Common wart Presentation and symptoms most correlate with verruca vulgaris. Cryotherapy provided with liquid nitrogen to the lesion. Patient tolerated procedure well, no complications noted.  Skin Procedure Lesion Location/Size: 0.5cm on medial wrist Physician: Orma Render, DNP, AGNP-c Consent:  Risks, benefits, and alternative treatments discussed and all questions were answered.  Patient elected to proceed and verbal consent obtained.  Description:  Liquid nitrogen applied to lesion(s) for 3 cycle(s).  Cryotherapy performed on a total of 1 lesion(s).    Post Procedure Instructions: Keep area clean and dry. Monitor for pain, redness, irritation, warmth and notify the office if any of this occurs.  Follow Up: None required unless problems occur or cryotherapy not effective.    Follow-up: Return in  about 1 year (around 09/05/2020) for HTN/HLD Follow-Up.  Or sooner if needed.   Orma Render, NP

## 2020-08-25 ENCOUNTER — Other Ambulatory Visit: Payer: Self-pay | Admitting: Nurse Practitioner

## 2020-08-25 DIAGNOSIS — I10 Essential (primary) hypertension: Secondary | ICD-10-CM

## 2020-10-19 ENCOUNTER — Other Ambulatory Visit: Payer: Self-pay | Admitting: Nurse Practitioner

## 2020-10-19 DIAGNOSIS — E785 Hyperlipidemia, unspecified: Secondary | ICD-10-CM

## 2020-11-24 ENCOUNTER — Other Ambulatory Visit: Payer: Self-pay | Admitting: Nurse Practitioner

## 2020-11-24 DIAGNOSIS — I10 Essential (primary) hypertension: Secondary | ICD-10-CM

## 2020-12-23 ENCOUNTER — Other Ambulatory Visit: Payer: Self-pay | Admitting: Osteopathic Medicine

## 2020-12-23 DIAGNOSIS — I10 Essential (primary) hypertension: Secondary | ICD-10-CM

## 2020-12-28 ENCOUNTER — Encounter: Payer: Self-pay | Admitting: Osteopathic Medicine

## 2020-12-28 ENCOUNTER — Telehealth: Payer: Self-pay | Admitting: Osteopathic Medicine

## 2020-12-28 ENCOUNTER — Other Ambulatory Visit: Payer: Self-pay | Admitting: Osteopathic Medicine

## 2020-12-28 DIAGNOSIS — Z0000 Encounter for general adult medical examination without abnormal findings: Secondary | ICD-10-CM

## 2020-12-28 DIAGNOSIS — I10 Essential (primary) hypertension: Secondary | ICD-10-CM

## 2020-12-28 DIAGNOSIS — E785 Hyperlipidemia, unspecified: Secondary | ICD-10-CM

## 2020-12-28 NOTE — Telephone Encounter (Signed)
PT scheduled his Physical and labs so can you place annual lab orders so they will will be ready for him when he comes to lab this month

## 2020-12-28 NOTE — Telephone Encounter (Signed)
Adrian Mccarty requesting a med refill for sildenafil. Rx not listed in active med list. Pt is also requesting annual labs to complete before his visit. He has an upcoming appointment on 01/20/2021.

## 2020-12-29 NOTE — Telephone Encounter (Signed)
Task completed. Annual labs ordered from provider. Please update the patient. Thanks in advance.

## 2021-01-14 ENCOUNTER — Other Ambulatory Visit: Payer: Self-pay

## 2021-01-14 ENCOUNTER — Other Ambulatory Visit: Payer: Managed Care, Other (non HMO)

## 2021-01-14 LAB — COMPLETE METABOLIC PANEL WITH GFR
AG Ratio: 1.9 (calc) (ref 1.0–2.5)
ALT: 39 U/L (ref 9–46)
AST: 24 U/L (ref 10–35)
Albumin: 4.6 g/dL (ref 3.6–5.1)
Alkaline phosphatase (APISO): 54 U/L (ref 35–144)
BUN: 17 mg/dL (ref 7–25)
CO2: 29 mmol/L (ref 20–32)
Calcium: 9.6 mg/dL (ref 8.6–10.3)
Chloride: 102 mmol/L (ref 98–110)
Creat: 0.87 mg/dL (ref 0.70–1.33)
GFR, Est African American: 117 mL/min/{1.73_m2} (ref >=60)
GFR, Est Non African American: 101 mL/min/{1.73_m2} (ref >=60)
Globulin: 2.4 g/dL (calc) (ref 1.9–3.7)
Glucose, Bld: 107 mg/dL — ABNORMAL HIGH (ref 65–99)
Potassium: 4.2 mmol/L (ref 3.5–5.3)
Sodium: 138 mmol/L (ref 135–146)
Total Bilirubin: 0.5 mg/dL (ref 0.2–1.2)
Total Protein: 7 g/dL (ref 6.1–8.1)

## 2021-01-14 LAB — CBC
HCT: 42.5 % (ref 38.5–50.0)
Hemoglobin: 14.5 g/dL (ref 13.2–17.1)
MCH: 31.5 pg (ref 27.0–33.0)
MCHC: 34.1 g/dL (ref 32.0–36.0)
MCV: 92.4 fL (ref 80.0–100.0)
MPV: 9.9 fL (ref 7.5–12.5)
Platelets: 278 10*3/uL (ref 140–400)
RBC: 4.6 10*6/uL (ref 4.20–5.80)
RDW: 11.6 % (ref 11.0–15.0)
WBC: 5.4 10*3/uL (ref 3.8–10.8)

## 2021-01-14 LAB — LIPID PANEL
Cholesterol: 176 mg/dL (ref <200)
HDL: 43 mg/dL (ref >=40)
LDL Cholesterol (Calc): 106 mg/dL (calc) — ABNORMAL HIGH
Non-HDL Cholesterol (Calc): 133 mg/dL (calc) — ABNORMAL HIGH (ref <130)
Total CHOL/HDL Ratio: 4.1 (calc) (ref <5.0)
Triglycerides: 154 mg/dL — ABNORMAL HIGH (ref <150)

## 2021-01-17 ENCOUNTER — Other Ambulatory Visit: Payer: Self-pay | Admitting: Osteopathic Medicine

## 2021-01-17 DIAGNOSIS — I10 Essential (primary) hypertension: Secondary | ICD-10-CM

## 2021-01-20 ENCOUNTER — Encounter: Payer: Managed Care, Other (non HMO) | Admitting: Osteopathic Medicine

## 2021-01-29 ENCOUNTER — Other Ambulatory Visit: Payer: Self-pay | Admitting: Osteopathic Medicine

## 2021-01-29 DIAGNOSIS — E785 Hyperlipidemia, unspecified: Secondary | ICD-10-CM

## 2021-02-01 ENCOUNTER — Encounter: Payer: Self-pay | Admitting: Osteopathic Medicine

## 2021-02-01 DIAGNOSIS — I10 Essential (primary) hypertension: Secondary | ICD-10-CM

## 2021-02-01 MED ORDER — LISINOPRIL-HYDROCHLOROTHIAZIDE 20-25 MG PO TABS: 1.0000 | ORAL_TABLET | Freq: Every day | ORAL | 0 refills | Status: DC

## 2021-02-11 ENCOUNTER — Other Ambulatory Visit: Payer: Self-pay

## 2021-02-11 ENCOUNTER — Ambulatory Visit (INDEPENDENT_AMBULATORY_CARE_PROVIDER_SITE_OTHER): Payer: Managed Care, Other (non HMO) | Admitting: Osteopathic Medicine

## 2021-02-11 ENCOUNTER — Encounter: Payer: Self-pay | Admitting: Osteopathic Medicine

## 2021-02-11 VITALS — BP 130/87 | HR 89 | Ht 71.25 in | Wt 228.7 lb

## 2021-02-11 DIAGNOSIS — Z1211 Encounter for screening for malignant neoplasm of colon: Secondary | ICD-10-CM | POA: Diagnosis not present

## 2021-02-11 DIAGNOSIS — Z0000 Encounter for general adult medical examination without abnormal findings: Secondary | ICD-10-CM

## 2021-02-11 DIAGNOSIS — E785 Hyperlipidemia, unspecified: Secondary | ICD-10-CM | POA: Diagnosis not present

## 2021-02-11 DIAGNOSIS — I10 Essential (primary) hypertension: Secondary | ICD-10-CM | POA: Diagnosis not present

## 2021-02-11 MED ORDER — ATORVASTATIN CALCIUM 10 MG PO TABS: ORAL_TABLET | ORAL | 3 refills | Status: DC

## 2021-02-11 MED ORDER — LISINOPRIL-HYDROCHLOROTHIAZIDE 20-25 MG PO TABS: 1.0000 | ORAL_TABLET | Freq: Every day | ORAL | 3 refills | Status: DC

## 2021-02-11 NOTE — Progress Notes (Signed)
Adrian Mccarty is a 50 y.o. male who presents to  [place] [facility]  today, 02/11/21, seeking care for the following:  [place] with other pertinent findings:  The primary encounter diagnosis was Annual physical exam. Diagnoses of Essential hypertension, Hyperlipidemia, unspecified hyperlipidemia type, and Colon cancer screening were also pertinent to this visit.   COLOGUARD ORDERED PT WOULD LIKE TO DEFER TDAP AND SHINGRIX HOME BP OK  BORDERLINE FASTING GLC, A1C NEXT LABS   Patient Instructions  General Preventive Care Most recent routine screening labs: see attached.  Blood pressure goal 130/80 or less.  Tobacco: don't!  Alcohol: responsible moderation is ok for most adults - if you have concerns about your alcohol intake, please talk to me!  Exercise: as tolerated to reduce risk of cardiovascular disease and diabetes. Strength training will also prevent osteoporosis.  Mental health: if need for mental health care (medicines, counseling, other), or concerns about moods, please let me know!  Sexual / Reproductive health: if need for STD testing, or if concerns with libido/pain problems, please let me know! Advanced Directive: Living Will and/or Healthcare Power of Attorney recommended for all adults, regardless of age or health.  Vaccines Flu vaccine: for almost everyone, every fall.  Shingles vaccine: after age 50.  Pneumonia vaccines: after age 61. Tetanus booster: every 10 years, DUE THIS YEAR COVID vaccine: THANKS for getting your vaccine! :) BOOSTERS STRONGLY RECOMMENDED Cancer screenings  Colon cancer screening: for everyone age 74-75. Colonoscopy available for all, many people also qualify for the Cologuard stool test  Prostate cancer screening: PSA blood test age 33 Lung cancer screening: CT chest every year for those aged 64 to 27 years who have a 20 pack-year smoking history and currently  smoke or have quit within the past 15 years  Infection screenings  HIV: recommended screening at least once age 33-65, more often as needed. Gonorrhea/Chlamydia: screening as needed Hepatitis C: recommended once for everyone age 0000000 TB: certain at-risk populations, or depending on work requirements and/or travel history Other Abdominal Aortic Aneurysm: screening with ultrasound recommended once for men age 7-75 who have ever smoked   Orders Placed This Encounter  Procedures   Cologuard     Meds ordered this encounter  Medications   atorvastatin (LIPITOR) 10 MG tablet    Sig: TAKE 1 TABLET BY MOUTH EVERY DAY    Dispense:  90 tablet    Refill:  3   lisinopril-hydrochlorothiazide (ZESTORETIC) 20-25 MG tablet    Sig: Take 1 tablet by mouth daily.    Dispense:  90 tablet    Refill:  3     See below for relevant physical exam findings  See below for recent lab and imaging results reviewed  Medications, allergies, PMH, PSH, SocH, FamH reviewed below    Follow-up instructions: Return in about 1 year (around 02/11/2022) for ANNUAL PHYSICAL - CALL us FOR APPOINTMENT .                                        Exam:  BP 130/87   Pulse 89   Ht 5' 11.25" (1.81 m)   Wt 228 lb 11.2 oz (103.7 kg)   SpO2 99%   BMI 31.67 kg/m  Constitutional: VS see above. General Appearance: alert, well-developed, well-nourished, NAD Neck: No masses, trachea midline.  Respiratory: Normal respiratory effort. no wheeze, no rhonchi, no rales Cardiovascular: S1/S2 normal, no murmur, no rub/gallop auscultated. RRR.  Musculoskeletal: Gait normal. Symmetric and independent movement of all extremities Abdominal: non-tender, non-distended, no appreciable organomegaly, neg Murphy's, BS WNLx4 Neurological: Normal balance/coordination. No tremor. Skin: warm, dry, intact.  Psychiatric: Normal judgment/insight. Normal mood and affect. Oriented x3.   Current Meds   Medication Sig   sildenafil (REVATIO) 20 MG tablet TAKE 2 TO 4 TABLETS BY MOUTH AS NEEDED PRIOR TO SEX   [DISCONTINUED] atorvastatin (LIPITOR) 10 MG tablet TAKE 1 TABLET BY MOUTH EVERY DAY/ keep appt for refills   [DISCONTINUED] lisinopril-hydrochlorothiazide (ZESTORETIC) 20-25 MG tablet Take 1 tablet by mouth daily. No refills. Overdue for a visit with provider.    Allergies  Allergen Reactions   Losartan Potassium-Hctz     Erectile Dysfunction    Patient Active Problem List   Diagnosis Date Noted   Left cervical radiculopathy 08/17/2016   Left shoulder pain 08/17/2016   White coat hypertension 05/13/2015   Hypertension 11/15/2011   Erectile dysfunction 11/15/2011   HYPERCHOLESTEROLEMIA 05/15/2009    Family History  Problem Relation Age of Onset   Heart disease Mother    Heart attack Mother        age: late 14's   Hypertension Father    Heart disease Maternal Grandfather    Heart disease Paternal Grandfather     Social History   Tobacco Use  Smoking Status Former  Smokeless Tobacco Former   Types: Chew    Past Surgical History:  Procedure Laterality Date   VASECTOMY      Immunization History  Administered Date(s) Administered   Influenza Split 06/28/2011   PFIZER(Purple Top)SARS-COV-2 Vaccination 09/27/2019, 10/23/2019, 06/26/2020   Tdap 06/28/2011    Recent Results (from the past 2160 hour(s))  CBC     Status: None   Collection Time: 01/14/21 12:00 AM  Result Value Ref Range   WBC 5.4 3.8 - 10.8 Thousand/uL   RBC 4.60 4.20 - 5.80 Million/uL   Hemoglobin 14.5 13.2 - 17.1 g/dL   HCT 42.5 38.5 - 50.0 %   MCV 92.4 80.0 - 100.0 fL   MCH 31.5 27.0 - 33.0 pg   MCHC 34.1 32.0 - 36.0 g/dL   RDW 11.6 11.0 - 15.0 %   Platelets 278 140 - 400 Thousand/uL   MPV 9.9 7.5 - 12.5 fL  COMPLETE METABOLIC PANEL WITH GFR     Status: Abnormal   Collection Time: 01/14/21 12:00 AM  Result Value Ref Range   Glucose, Bld 107 (H) 65 - 99 mg/dL    Comment: .             Fasting reference interval . For someone without known diabetes, a glucose value between 100 and 125 mg/dL is consistent with prediabetes and should be confirmed with a follow-up test. .    BUN 17 7 - 25 mg/dL   Creat 0.87 0.70 - 1.33 mg/dL    Comment: For patients >49 years of age, the reference limit for Creatinine is approximately 13% higher for people identified as African-American. .    GFR, Est Non African American 101 > OR = 60 mL/min/1.7m   GFR, Est African American 117 > OR = 60 mL/min/1.780m  BUN/Creatinine Ratio NOT APPLICABLE 6 - 22 (calc)   Sodium 138 135 - 146 mmol/L   Potassium 4.2 3.5 - 5.3 mmol/L   Chloride 102 98 - 110 mmol/L   CO2 29 20 - 32  mmol/L   Calcium 9.6 8.6 - 10.3 mg/dL   Total Protein 7.0 6.1 - 8.1 g/dL   Albumin 4.6 3.6 - 5.1 g/dL   Globulin 2.4 1.9 - 3.7 g/dL (calc)   AG Ratio 1.9 1.0 - 2.5 (calc)   Total Bilirubin 0.5 0.2 - 1.2 mg/dL   Alkaline phosphatase (APISO) 54 35 - 144 U/L   AST 24 10 - 35 U/L   ALT 39 9 - 46 U/L  Lipid panel     Status: Abnormal   Collection Time: 01/14/21 12:00 AM  Result Value Ref Range   Cholesterol 176 <200 mg/dL   HDL 43 > OR = 40 mg/dL   Triglycerides 154 (H) <150 mg/dL   LDL Cholesterol (Calc) 106 (H) mg/dL (calc)    Comment: Reference range: <100 . Desirable range <100 mg/dL for primary prevention;   <70 mg/dL for patients with CHD or diabetic patients  with > or = 2 CHD risk factors. Marland Kitchen LDL-C is now calculated using the Martin-Hopkins  calculation, which is a validated novel method providing  better accuracy than the Friedewald equation in the  estimation of LDL-C.  Cresenciano Genre et al. Annamaria Helling. [id number]): 2061-2068  (http://education.[facility].com/faq/FAQ164)    Total CHOL/HDL Ratio 4.1 <5.0 (calc)   Non-HDL Cholesterol (Calc) 133 (H) <130 mg/dL (calc)    Comment: For patients with diabetes plus 1 major ASCVD risk  factor, treating to a non-HDL-C goal of <100 mg/dL  (LDL-C of <70 mg/dL) is  considered a therapeutic  option.     No results found.     All questions at time of visit were answered - patient instructed to contact office with any additional concerns or updates. ER/RTC precautions were reviewed with the patient as applicable.   Please note: manual typing as well as voice recognition software may have been used to produce this document - typos may escape review. Please contact Dr. Sheppard Coil for any needed clarifications.

## 2021-02-11 NOTE — Patient Instructions (Addendum)
General Preventive Care Most recent routine screening labs: see attached.  Blood pressure goal 130/80 or less.  Tobacco: don't!  Alcohol: responsible moderation is ok for most adults - if you have concerns about your alcohol intake, please talk to me!  Exercise: as tolerated to reduce risk of cardiovascular disease and diabetes. Strength training will also prevent osteoporosis.  Mental health: if need for mental health care (medicines, counseling, other), or concerns about moods, please let me know!  Sexual / Reproductive health: if need for STD testing, or if concerns with libido/pain problems, please let me know! Advanced Directive: Living Will and/or Healthcare Power of Attorney recommended for all adults, regardless of age or health.  Vaccines Flu vaccine: for almost everyone, every fall.  Shingles vaccine: after age 7.  Pneumonia vaccines: after age 29. Tetanus booster: every 10 years, DUE THIS YEAR COVID vaccine: THANKS for getting your vaccine! :) BOOSTERS STRONGLY RECOMMENDED Cancer screenings  Colon cancer screening: for everyone age 50-75. Colonoscopy available for all, many people also qualify for the Cologuard stool test  Prostate cancer screening: PSA blood test age 34 Lung cancer screening: CT chest every year for those aged 56 to 30 years who have a 20 pack-year smoking history and currently smoke or have quit within the past 15 years  Infection screenings  HIV: recommended screening at least once age 66-65, more often as needed. Gonorrhea/Chlamydia: screening as needed Hepatitis C: recommended once for everyone age 0000000 TB: certain at-risk populations, or depending on work requirements and/or travel history Other Abdominal Aortic Aneurysm: screening with ultrasound recommended once for men age 80-75 who have ever smoked

## 2021-03-07 DIAGNOSIS — M545 Low back pain, unspecified: Secondary | ICD-10-CM | POA: Insufficient documentation

## 2021-03-15 ENCOUNTER — Encounter: Payer: Self-pay | Admitting: Osteopathic Medicine

## 2021-03-16 ENCOUNTER — Other Ambulatory Visit: Payer: Self-pay

## 2021-03-16 ENCOUNTER — Ambulatory Visit (INDEPENDENT_AMBULATORY_CARE_PROVIDER_SITE_OTHER): Payer: Managed Care, Other (non HMO)

## 2021-03-16 ENCOUNTER — Ambulatory Visit: Payer: Managed Care, Other (non HMO) | Admitting: Sports Medicine

## 2021-03-16 DIAGNOSIS — M5136 Other intervertebral disc degeneration, lumbar region: Secondary | ICD-10-CM | POA: Diagnosis not present

## 2021-03-16 DIAGNOSIS — M51369 Other intervertebral disc degeneration, lumbar region without mention of lumbar back pain or lower extremity pain: Secondary | ICD-10-CM | POA: Insufficient documentation

## 2021-03-16 MED ORDER — PREDNISONE 50 MG PO TABS: ORAL_TABLET | ORAL | 0 refills | Status: DC

## 2021-03-16 MED ORDER — CYCLOBENZAPRINE HCL 10 MG PO TABS: ORAL_TABLET | ORAL | 0 refills | Status: DC

## 2021-03-16 MED ORDER — MELOXICAM 15 MG PO TABS: ORAL_TABLET | ORAL | 3 refills | Status: DC

## 2021-03-16 NOTE — Progress Notes (Signed)
    Procedures performed today:    None.  Independent interpretation of notes and tests performed by another provider:   None.  Brief History, Exam, Impression, and Recommendations:    DDD (degenerative disc disease), lumbar This is a pleasant 50 year old male, severe axial low back pain, worse with flexion, Valsalva. Positive straight leg raise. No red flag symptoms. Paresthesias over the anterior thighs consistent with an L4 radiculopathy. Adding 5 days of prednisone, Flexeril, meloxicam, formal PT, x-rays. Return to see me in 6 weeks, MRI for interventional planning if no better.    ___________________________________________ Gwen Her. Dianah Field, M.D., ABFM., CAQSM. Primary Care and [place] Instructor of [place] [employer] [facility]

## 2021-03-16 NOTE — Assessment & Plan Note (Signed)
This is a pleasant 50 year old male, severe axial low back pain, worse with flexion, Valsalva. Positive straight leg raise. No red flag symptoms. Paresthesias over the anterior thighs consistent with an L4 radiculopathy. Adding 5 days of prednisone, Flexeril, meloxicam, formal PT, x-rays. Return to see me in 6 weeks, MRI for interventional planning if no better.

## 2021-03-19 ENCOUNTER — Ambulatory Visit: Payer: Managed Care, Other (non HMO) | Admitting: Physical Therapy

## 2021-03-19 ENCOUNTER — Other Ambulatory Visit: Payer: Self-pay

## 2021-03-19 ENCOUNTER — Encounter: Payer: Self-pay | Admitting: Physical Therapy

## 2021-03-19 DIAGNOSIS — M6281 Muscle weakness (generalized): Secondary | ICD-10-CM

## 2021-03-19 DIAGNOSIS — R29898 Other symptoms and signs involving the musculoskeletal system: Secondary | ICD-10-CM

## 2021-03-19 DIAGNOSIS — M545 Low back pain, unspecified: Secondary | ICD-10-CM

## 2021-03-19 NOTE — Therapy (Signed)
[place], [place], [address] Phone: [phone]   Fax:  [phone]  Physical Therapy Evaluation  Patient Details  Name: Adrian Mccarty MRN: [id number] Date of Birth: July 27, 1970 Referring Provider (PT): Thekkekandam   Encounter Date: 03/19/2021   PT End of Session - 03/19/21 1056     Visit Number 1    Number of Visits 12    Date for PT Re-Evaluation 04/30/21    PT Start Time 1010    PT Stop Time 1050    PT Time Calculation (min) 40 min    Activity Tolerance Patient tolerated treatment well    Behavior During Therapy [facility] for tasks assessed/performed             Past Medical History:  Diagnosis Date   Erectile dysfunction    GERD (gastroesophageal reflux disease)    Hyperlipidemia    Hypertension     Past Surgical History:  Procedure Laterality Date   VASECTOMY      There were no vitals filed for this visit.    Subjective Assessment - 03/19/21 1011     Subjective Pt was working out at the gym 2 weeks ago and felt his back was "sore". The next morning he had increased pain that increased after a long drive and then doing yard work. Pt saw MD who prescribed prednisone and PT. Pt states meds have helped "a little" but he still feels increased pain with prolonged sitting, standing and with performing sit to stand. Pt states his Lt anterior thigh tingles with prolonged sitting. Pain decreases with laying supine    How long can you sit comfortably? 10-15 minutes    How long can you stand comfortably? 1 hour    Diagnostic tests x ray unremarkable    Patient Stated Goals decrease pain and return to regular gym routine    Currently in Pain? Yes    Pain Score 4     Pain Location Back    Pain Orientation Left;Lower    Pain Descriptors / Indicators Aching;Sore    Pain Type Acute pain    Pain Radiating Towards Lt anterior thigh    Pain Onset 1 to 4 weeks ago    Pain Frequency Intermittent     Aggravating Factors  sitting, standing, sit to stand    Pain Relieving Factors pain meds, laying supine                [facility] PT Assessment - 03/19/21 0001       Assessment   Medical Diagnosis lumbar DDD    Referring Provider (PT) Thekkekandam    Onset Date/Surgical Date 03/06/21    Next MD Visit 04/27/21      Balance Screen   Has the patient fallen in the past 6 months No      Prior Function   Level of Independence Independent      Observation/Other Assessments   Focus on Therapeutic Outcomes (FOTO)  59 functional status measure      ROM / Strength   AROM / PROM / Strength AROM;Strength      AROM   AROM Assessment Site Lumbar    Lumbar Flexion limited 50% - pain    Lumbar Extension WFL    Lumbar - Right [facility]    Lumbar - Left [facility]    Lumbar - Right Rotation [facility]    Lumbar - Left Rotation [facility]      Strength  Strength Assessment Site Hip    Right/Left Hip Right;Left    Right Hip Flexion 4-/5    Right Hip Extension 4+/5    Right Hip ABduction 4+/5    Left Hip Flexion 3/5    Left Hip Extension 4+/5      Flexibility   Soft Tissue Assessment /Muscle Length yes    Hamstrings decreased bilat Lt > Rt    Quadriceps decreased bilat    Piriformis decreased bilat      Palpation   SI assessment  hypomobile CPAs and UPAs    Palpation comment TTP Lt proximal glutes, Lt lumbar paraspinals L3-L5      Special Tests   Other special tests SLR negative bilat                        Objective measurements completed on examination: See above findings.       Day Adult PT Treatment/Exercise - 03/19/21 0001       Exercises   Exercises Lumbar      Lumbar Exercises: Stretches   Passive Hamstring Stretch Right;Left;20 seconds    Passive Hamstring Stretch Limitations supine with strap    Single Knee to Chest Stretch Right;Left;20 seconds    Single Knee to Chest Stretch Limitations supine with strap    Piriformis Stretch Right;Left;20  seconds    Other Lumbar Stretch Exercise quad stretch x 20 sec bilat LE      Modalities   Modalities --   pt states he will use home TENS unit                   PT Education - 03/19/21 1040     Education Details PT POC and goals, HEP    Person(s) Educated Patient    Methods Explanation;Demonstration;Handout    Comprehension Verbalized understanding;Returned demonstration                 PT Long Term Goals - 03/19/21 1100       PT LONG TERM GOAL #1   Title Pt will be independent with HEP    Time 6    Period Weeks    Status New    Target Date 04/30/21      PT LONG TERM GOAL #2   Title Pt will improve FOTO to >= 79 to demo improved functional mobility    Time 6    Period Weeks    Status New    Target Date 04/30/21      PT LONG TERM GOAL #3   Title Pt will improve Lt hip flexion strength to 4+/5 to be able to return to the gym    Time 6    Period Weeks    Status New    Target Date 04/30/21      PT LONG TERM GOAL #4   Title Pt will tolerate sitting x 30 minutes with pain <= 2/10    Time 6    Period Weeks    Status New    Target Date 04/30/21                    Plan - 03/19/21 1057     Clinical Impression Statement Pt is a 50 y/o male referred for lumbar DDD. Pt presents with decreased strength and ROM, increased pain, decreased functional activity tolerance and mobility and will benefit from skilled PT to address deficits and improve functional mobility.    Personal Factors and Comorbidities  Fitness;Profession    Examination-Activity Limitations Lift;Sit;Transfers;Bend    Examination-Participation Restrictions Community Activity;Occupation;Yard Work    Stability/Clinical Decision Making Stable/Uncomplicated    Clinical Decision Making Low    Rehab Potential Good    PT Frequency 2x / week    PT Duration 6 weeks    PT Treatment/Interventions Dry needling;Taping;Manual techniques;Patient/family education;Neuromuscular  re-education;Balance training;Therapeutic exercise;Therapeutic activities;Electrical Stimulation;Aquatic Therapy;Iontophoresis '4mg'$ /ml Dexamethasone;Moist Heat;Traction;Cryotherapy    PT Next Visit Plan Add core strengthening, progress HEP, manual/modalities as needed    PT Home Exercise Plan [id number]    Consulted and Agree with Plan of Care Patient             Patient will benefit from skilled therapeutic intervention in order to improve the following deficits and impairments:  Pain, Decreased strength, Decreased activity tolerance, Decreased range of motion, Increased muscle spasms, Hypomobility  Visit Diagnosis: Muscle weakness (generalized) - Plan: PT plan of care cert/re-cert  Acute left-sided low back pain without sciatica - Plan: PT plan of care cert/re-cert  Other symptoms and signs involving the musculoskeletal system - Plan: PT plan of care cert/re-cert     Problem List Patient Active Problem List   Diagnosis Date Noted   DDD (degenerative disc disease), lumbar 03/16/2021   Left cervical radiculopathy 08/17/2016   Left shoulder pain 08/17/2016   White coat hypertension 05/13/2015   Hypertension 11/15/2011   Erectile dysfunction 11/15/2011   HYPERCHOLESTEROLEMIA 05/15/2009  Stephen Baruch, PT   Taeshaun Rames 03/19/2021, 11:20 AM  [facility] [place], [place], [address] Phone: [phone]   Fax:  [phone]  Name: Lavon Richards MRN: [id number] Date of Birth: Jun 18, 1971

## 2021-03-19 NOTE — Patient Instructions (Signed)
Access Code: [email or web address] URL: [email or web address] Date: 03/19/2021 Prepared by: Isabelle Course  Exercises Hooklying Hamstring Stretch with Strap - 1 x daily - 7 x weekly - 3 sets - 1 reps - 20-30 sec hold Supine Piriformis Stretch with Foot on Ground - 1 x daily - 7 x weekly - 3 sets - 1 reps - 20-30 sec hold Hooklying Single Knee to Chest - 1 x daily - 7 x weekly - 3 sets - 1 reps - 20-30 sec hold Prone Quadriceps Stretch with Strap - 1 x daily - 7 x weekly - 3 sets - 1 reps - 20-30 sec hold

## 2021-03-24 ENCOUNTER — Other Ambulatory Visit: Payer: Self-pay | Admitting: Osteopathic Medicine

## 2021-03-24 DIAGNOSIS — R195 Other fecal abnormalities: Secondary | ICD-10-CM

## 2021-03-24 LAB — COLOGUARD: Cologuard: POSITIVE — AB

## 2021-03-26 ENCOUNTER — Ambulatory Visit: Payer: Managed Care, Other (non HMO) | Admitting: Physical Therapy

## 2021-03-29 ENCOUNTER — Ambulatory Visit (INDEPENDENT_AMBULATORY_CARE_PROVIDER_SITE_OTHER): Payer: Managed Care, Other (non HMO) | Admitting: Physical Therapy

## 2021-03-29 ENCOUNTER — Other Ambulatory Visit: Payer: Self-pay

## 2021-03-29 DIAGNOSIS — M6281 Muscle weakness (generalized): Secondary | ICD-10-CM

## 2021-03-29 DIAGNOSIS — M545 Low back pain, unspecified: Secondary | ICD-10-CM

## 2021-03-29 DIAGNOSIS — R29898 Other symptoms and signs involving the musculoskeletal system: Secondary | ICD-10-CM | POA: Diagnosis not present

## 2021-03-29 NOTE — Patient Instructions (Addendum)
  Access Code: [email or web address] URL: [email or web address] Date: 03/29/2021 Prepared by: Ely  Exercises Standing Quadriceps Stretch - 1 x daily - 7 x weekly - 1 sets - 2-3 reps - 20-30 hold Hooklying Hamstring Stretch with Strap - 1 x daily - 7 x weekly - 1 sets - 2-3 reps - 20-30 hold Supine Piriformis Stretch with Foot on Ground - 1 x daily - 7 x weekly - 1 sets - 2-3 reps - 20-30 sec hold Prone on Elbows Stretch - 1 x daily - 7 x weekly - 1 sets - 5 reps Cat Cow - 1 x daily - 7 x weekly - 1 sets - 5 reps Sidelying Thoracic Rotation with Open Book - 1 x daily - 7 x weekly - 1 sets - 5 reps

## 2021-03-29 NOTE — Therapy (Signed)
[place], [place], [address] Phone: [phone]   Fax:  [phone]  Physical Therapy Treatment  Patient Details  Name: Adrian Mccarty MRN: [id number] Date of Birth: Oct 17, 1970 Referring Provider (PT): Thekkekandam   Encounter Date: 03/29/2021   PT End of Session - 03/29/21 0716     Visit Number 2    Number of Visits 12    Date for PT Re-Evaluation 04/30/21    PT Start Time 0719    PT Stop Time 0757    PT Time Calculation (min) 38 min    Activity Tolerance Patient tolerated treatment well    Behavior During Therapy [facility] for tasks assessed/performed             Past Medical History:  Diagnosis Date   Erectile dysfunction    GERD (gastroesophageal reflux disease)    Hyperlipidemia    Hypertension     Past Surgical History:  Procedure Laterality Date   VASECTOMY      There were no vitals filed for this visit.   Subjective Assessment - 03/29/21 0719     Subjective Pt reports his back pain is much improved.  He has not returned to lifting yet, just walking and stretching.  He complains of pain with transitional movements and pain after driving.    Patient Stated Goals decrease pain and return to regular gym routine    Currently in Pain? No/denies    Pain Score 0-No pain                [facility] PT Assessment - 03/29/21 0001       Assessment   Medical Diagnosis lumbar DDD    Referring Provider (PT) Thekkekandam    Onset Date/Surgical Date 03/06/21    Next MD Visit 04/27/21               [facility] Adult PT Treatment/Exercise - 03/29/21 0001       Lumbar Exercises: Stretches   Passive Hamstring Stretch Right;Left;2 reps;20 seconds   seated with hip hinge   Passive Hamstring Stretch Limitations 2 addtional reps in hooklying.(pt prefers this position).    Hip Flexor Stretch Left;Right;2 reps;20 seconds   2nd rep with arm overhead   Prone on Elbows Stretch 5 reps;10 seconds     Quad Stretch Right;2 reps;Left;1 rep;20 seconds , standing (preferred)   Piriformis Stretch Left;2 reps;Right;3 reps;20 seconds (seated; prefers supine)   Other Lumbar Stretch Exercise sidelying open book x 5 reps each side.      Lumbar Exercises: Aerobic   Tread Mill up to 3.5 mph x 5 min for warm up      Lumbar Exercises: Seated   Sit to Stand --   8 reps   Sit to Stand Limitations simulating getting in/out of car with core engaged, cues for form.      Lumbar Exercises: Quadruped   Madcat/Old Horse 5 reps    Madcat/Old Horse Limitations and wag the tail x 5                     PT Education - 03/29/21 0759     Education Details HEP updated.    Person(s) Educated Patient    Methods Explanation;Handout;Demonstration;Verbal cues    Comprehension Returned demonstration;Verbalized understanding                 PT Long Term Goals - 03/19/21 1100       PT LONG TERM GOAL #  1   Title Pt will be independent with HEP    Time 6    Period Weeks    Status New    Target Date 04/30/21      PT LONG TERM GOAL #2   Title Pt will improve FOTO to >= 79 to demo improved functional mobility    Time 6    Period Weeks    Status New    Target Date 04/30/21      PT LONG TERM GOAL #3   Title Pt will improve Lt hip flexion strength to 4+/5 to be able to return to the gym    Time 6    Period Weeks    Status New    Target Date 04/30/21      PT LONG TERM GOAL #4   Title Pt will tolerate sitting x 30 minutes with pain <= 2/10    Time 6    Period Weeks    Status New    Target Date 04/30/21                   Plan - 03/29/21 0730     Clinical Impression Statement Positive response to HEP exercises thus far.  Discussed proper back support for car and work. Modified HEP to include lumbar extension.  Pt tolerated all exercises well , without any production of symptoms.  Pt to gradually ease back into gym routine, including eliptical.  Goals are ongoing.    Personal  Factors and Comorbidities Fitness;Profession    Examination-Activity Limitations Lift;Sit;Transfers;Bend    Examination-Participation Restrictions Community Activity;Occupation;Yard Work    Stability/Clinical Decision Making Stable/Uncomplicated    Rehab Potential Good    PT Frequency 2x / week    PT Duration 6 weeks    PT Treatment/Interventions Dry needling;Taping;Manual techniques;Patient/family education;Neuromuscular re-education;Balance training;Therapeutic exercise;Therapeutic activities;Electrical Stimulation;Aquatic Therapy;Iontophoresis '4mg'$ /ml Dexamethasone;Moist Heat;Traction;Cryotherapy    PT Next Visit Plan Add core strengthening, progress HEP, manual/modalities as needed    PT Home Exercise Plan [id number]    Consulted and Agree with Plan of Care Patient             Patient will benefit from skilled therapeutic intervention in order to improve the following deficits and impairments:  Pain, Decreased strength, Decreased activity tolerance, Decreased range of motion, Increased muscle spasms, Hypomobility  Visit Diagnosis: Muscle weakness (generalized)  Acute left-sided low back pain without sciatica  Other symptoms and signs involving the musculoskeletal system     Problem List Patient Active Problem List   Diagnosis Date Noted   DDD (degenerative disc disease), lumbar 03/16/2021   Left cervical radiculopathy 08/17/2016   Left shoulder pain 08/17/2016   White coat hypertension 05/13/2015   Hypertension 11/15/2011   Erectile dysfunction 11/15/2011   HYPERCHOLESTEROLEMIA 05/15/2009   Adrian Mccarty, PTA 03/29/21 8:01 AM  [place], [place], [address] Phone: [phone]   Fax:  [phone]  Name: Adrian Mccarty MRN: [id number] Date of Birth: 05-30-1971

## 2021-04-02 ENCOUNTER — Other Ambulatory Visit: Payer: Self-pay

## 2021-04-02 ENCOUNTER — Encounter: Payer: Self-pay | Admitting: Rehabilitative and Restorative Service Providers"

## 2021-04-02 ENCOUNTER — Ambulatory Visit (INDEPENDENT_AMBULATORY_CARE_PROVIDER_SITE_OTHER): Payer: Managed Care, Other (non HMO) | Admitting: Rehabilitative and Restorative Service Providers"

## 2021-04-02 DIAGNOSIS — R29898 Other symptoms and signs involving the musculoskeletal system: Secondary | ICD-10-CM | POA: Diagnosis not present

## 2021-04-02 DIAGNOSIS — M6281 Muscle weakness (generalized): Secondary | ICD-10-CM | POA: Diagnosis not present

## 2021-04-02 DIAGNOSIS — M545 Low back pain, unspecified: Secondary | ICD-10-CM

## 2021-04-02 NOTE — Patient Instructions (Signed)
Access Code: [email or web address] URL: [email or web address] Date: 04/02/2021 Prepared by: Gillermo Murdoch  Exercises Standing Quadriceps Stretch - 1 x daily - 7 x weekly - 1 sets - 2-3 reps - 20-30 hold Hooklying Hamstring Stretch with Strap - 1 x daily - 7 x weekly - 1 sets - 2-3 reps - 20-30 hold Supine Piriformis Stretch with Foot on Ground - 1 x daily - 7 x weekly - 1 sets - 2-3 reps - 20-30 sec hold Prone on Elbows Stretch - 1 x daily - 7 x weekly - 1 sets - 5 reps Cat Cow - 1 x daily - 7 x weekly - 1 sets - 5 reps Sidelying Thoracic Rotation with Open Book - 1 x daily - 7 x weekly - 1 sets - 5 reps Supine Transversus Abdominis Bracing with Pelvic Floor Contraction - 2 x daily - 7 x weekly - 1 sets - 10 reps - 10sec hold Sit to Stand - 2 x daily - 7 x weekly - 1 sets - 10 reps - 3-5 sec hold Wall Quarter Squat - 2 x daily - 7 x weekly - 1-2 sets - 10 reps - 5-10 sec hold Standing Bilateral Low Shoulder Row with Anchored Resistance - 2 x daily - 7 x weekly - 1-3 sets - 10 reps - 2-3 sec hold Drawing Bow - 1 x daily - 7 x weekly - 1 sets - 10 reps - 3 sec hold Anti-Rotation Lateral Stepping with Press - 2 x daily - 7 x weekly - 1-2 sets - 10 reps - 2-3 sec hold Kettlebell Deadlift

## 2021-04-02 NOTE — Therapy (Signed)
Adrian Mccarty, [place], [address] Phone: [phone]   Fax:  [phone]  Physical Therapy Treatment  Patient Details  Name: Adrian Mccarty MRN: [id number] Date of Birth: 1971/07/06 Referring Provider (PT): Thekkekandam   Encounter Date: 04/02/2021   PT End of Session - 04/02/21 1319     Visit Number 3    Number of Visits 12    Date for PT Re-Evaluation 04/30/21    PT Start Time 1418    PT Stop Time 1500    PT Time Calculation (min) 42 min    Activity Tolerance Patient tolerated treatment well             Past Medical History:  Diagnosis Date   Erectile dysfunction    GERD (gastroesophageal reflux disease)    Hyperlipidemia    Hypertension     Past Surgical History:  Procedure Laterality Date   VASECTOMY      There were no vitals filed for this visit.   Subjective Assessment - 04/02/21 1320     Subjective Patient reports that he is doing a lot better. He is not feeling the same pain that he was when he moves from sit to stand. Still tries to avoid prolonged sitting or standing. He has taken much less medication. Only one Aleve when he drove to the beach. Walked a lot at [employer] but did fewer of the exercises.    Currently in Pain? No/denies    Pain Score 0-No pain    Pain Location Back                               [facility] Adult PT Treatment/Exercise - 04/02/21 0001       Self-Care   Self-Care Other Self-Care Comments    Other Self-Care Comments  education re sitting posture and alignment using pillow and/or foam rolls (noodles)      Lumbar Exercises: Stretches   Passive Hamstring Stretch Right;Left;2 reps;20 seconds   seated with hip hinge   Hip Flexor Stretch Left;Right;2 reps;30 seconds   2nd rep with arm overhead, sitting   Prone on Elbows Stretch 5 reps;10 seconds    Piriformis Stretch Left;2 reps;Right;3 reps;20 seconds    Other Lumbar Stretch Exercise  sidelying open book x 5 reps each side.      Lumbar Exercises: Aerobic   Elliptical L2 x 4 min end of treatment    Tread Mill 3.5 mph x 6 min for warm up      Lumbar Exercises: Standing   Lifting 10 reps    Lifting Weights (lbs) 20    Lifting Limitations from 8 inch stool VC to engage core - hinged hips    Wall Slides 10 reps    Wall Slides Limitations 10 sec hold core engaged    Row Strengthening;Both;10 reps;Theraband    Theraband Level (Row) Level 4 (Blue)    Row Limitations bow and arrow x 10 each side blue TB    Other Standing Lumbar Exercises antirotation red TB x 10 each side core engaged      Lumbar Exercises: Seated   Sit to Stand 10 reps    Sit to Stand Limitations VC for core engaged hinged hips chest up      Lumbar Exercises: Supine   Other Supine Lumbar Exercises 3 part core 10 sec hold x 10 reps  PT Education - 04/02/21 1351     Education Details HEP sitting suggestions for car    Person(s) Educated Patient    Methods Explanation;Demonstration;Tactile cues;Verbal cues;Handout    Comprehension Verbalized understanding;Returned demonstration;Verbal cues required;Tactile cues required                 PT Long Term Goals - 03/19/21 1100       PT LONG TERM GOAL #1   Title Pt will be independent with HEP    Time 6    Period Weeks    Status New    Target Date 04/30/21      PT LONG TERM GOAL #2   Title Pt will improve FOTO to >= 79 to demo improved functional mobility    Time 6    Period Weeks    Status New    Target Date 04/30/21      PT LONG TERM GOAL #3   Title Pt will improve Lt hip flexion strength to 4+/5 to be able to return to the gym    Time 6    Period Weeks    Status New    Target Date 04/30/21      PT LONG TERM GOAL #4   Title Pt will tolerate sitting x 30 minutes with pain <= 2/10    Time 6    Period Weeks    Status New    Target Date 04/30/21                   Plan - 04/02/21 1323      Clinical Impression Statement Excellent progress with decreased pain in LB. Still has some tingling in the Lt LE worst when he is sitting in the car. Progressed with core stabilization and strengthening    Rehab Potential Good    PT Frequency 2x / week    PT Duration 6 weeks    PT Treatment/Interventions Dry needling;Taping;Manual techniques;Patient/family education;Neuromuscular re-education;Balance training;Therapeutic exercise;Therapeutic activities;Electrical Stimulation;Aquatic Therapy;Iontophoresis '4mg'$ /ml Dexamethasone;Moist Heat;Traction;Cryotherapy    PT Next Visit Plan Add core strengthening, progress HEP, manual/modalities as needed    PT Home Exercise Plan [id number]    Consulted and Agree with Plan of Care Patient             Patient will benefit from skilled therapeutic intervention in order to improve the following deficits and impairments:     Visit Diagnosis: Muscle weakness (generalized)  Acute left-sided low back pain without sciatica  Other symptoms and signs involving the musculoskeletal system     Problem List Patient Active Problem List   Diagnosis Date Noted   DDD (degenerative disc disease), lumbar 03/16/2021   Left cervical radiculopathy 08/17/2016   Left shoulder pain 08/17/2016   White coat hypertension 05/13/2015   Hypertension 11/15/2011   Erectile dysfunction 11/15/2011   HYPERCHOLESTEROLEMIA 05/15/2009    Adrian Mccarty Adrian Mccarty, PT, MPH  04/02/2021, 2:02 PM  [facility] [place], [place], [address] Phone: [phone]   Fax:  [phone]  Name: Adrian Mccarty MRN: [id number] Date of Birth: October 12, 1970

## 2021-04-05 ENCOUNTER — Encounter: Payer: Self-pay | Admitting: Osteopathic Medicine

## 2021-04-06 ENCOUNTER — Other Ambulatory Visit: Payer: Self-pay

## 2021-04-06 ENCOUNTER — Ambulatory Visit (INDEPENDENT_AMBULATORY_CARE_PROVIDER_SITE_OTHER): Payer: Managed Care, Other (non HMO) | Admitting: Physical Therapy

## 2021-04-06 DIAGNOSIS — R29898 Other symptoms and signs involving the musculoskeletal system: Secondary | ICD-10-CM | POA: Diagnosis not present

## 2021-04-06 DIAGNOSIS — M6281 Muscle weakness (generalized): Secondary | ICD-10-CM | POA: Diagnosis not present

## 2021-04-06 DIAGNOSIS — M545 Low back pain, unspecified: Secondary | ICD-10-CM

## 2021-04-06 NOTE — Therapy (Addendum)
[facility] [place], [place], [address] Phone: [phone]   Fax:  [phone]  Physical Therapy Treatment and Discharge  Patient Details  Name: Adrian Mccarty MRN: [id number] Date of Birth: 10-02-1970 Referring Provider (PT): Thekkekandam   Encounter Date: 04/06/2021   PT End of Session - 04/06/21 1017     Visit Number 4    Number of Visits 12    Date for PT Re-Evaluation 04/30/21    PT Start Time 1013    PT Stop Time 1051    PT Time Calculation (min) 38 min    Activity Tolerance Patient tolerated treatment well             Past Medical History:  Diagnosis Date   Erectile dysfunction    GERD (gastroesophageal reflux disease)    Hyperlipidemia    Hypertension     Past Surgical History:  Procedure Laterality Date   VASECTOMY      There were no vitals filed for this visit.   Subjective Assessment - 04/06/21 1019     Subjective Pt has eased back into gym routine.  He continues to have lower back discomfort when sitting too long.  He is still trying to figure out lumbar support for car.  He is pleased with progress so far; 80 % improvement.  Lt thigh only tingles if sitting too long. Pain no longer constant.    How long can you sit comfortably? 1 hr    Diagnostic tests x ray unremarkable    Patient Stated Goals decrease pain and return to regular gym routine    Currently in Pain? Yes    Pain Score 3     Pain Location Back    Pain Orientation Left;Right;Lower    Pain Descriptors / Indicators Sore                [facility] PT Assessment - 04/06/21 0001       Assessment   Medical Diagnosis lumbar DDD    Referring Provider (PT) Thekkekandam    Onset Date/Surgical Date 03/06/21    Next MD Visit 04/27/21      Strength   Right Hip Flexion 5/5    Right Hip Extension 5/5    Right Hip ABduction 5/5    Left Hip Flexion --   5-/5   Left Hip Extension 5/5    Left Hip ABduction 5/5               [facility] Adult PT Treatment/Exercise - 04/06/21 0001       Lumbar Exercises: Stretches   Passive Hamstring Stretch Left;Right;3 reps;30 seconds    Hip Flexor Stretch Right;Left;1 rep;20 seconds   thomas position   Prone on Elbows Stretch 3 reps;10 seconds      Lumbar Exercises: Standing   Lifting 10 reps   tactile cues; hip hinge   Lifting Weights (lbs) 20    Lifting Limitations from 8 inch stool VC to engage core - hinged hips    Row Strengthening;10 reps    Theraband Level (Row) Level 4 (Blue)    Row Limitations reg, then bow and arrow each side.    Other Standing Lumbar Exercises antirotation green TB x 10 each side core engaged      Lumbar Exercises: Quadruped   Madcat/Old Horse 5 reps                     PT Education - 04/06/21 1059  Education Details HEP -reviewed and added hip flexor stretch    Methods Explanation;Handout;Verbal cues;Tactile cues;Demonstration    Comprehension Verbalized understanding;Returned demonstration                 PT Long Term Goals - 04/06/21 1028       PT LONG TERM GOAL #1   Title Pt will be independent with HEP    Time 6    Period Weeks    Status Achieved      PT LONG TERM GOAL #2   Title Pt will improve FOTO to >= 79 to demo improved functional mobility    Time 6    Period Weeks    Status Achieved -90     PT LONG TERM GOAL #3   Title Pt will improve Lt hip flexion strength to 4+/5 to be able to return to the gym    Time 6    Period Weeks    Status Achieved      PT LONG TERM GOAL #4   Title Pt will tolerate sitting x 30 minutes with pain <= 2/10    Time 6    Period Weeks    Status Achieved                   Plan - 04/06/21 1252     Clinical Impression Statement Positive response to exercises and modification of posture with sitting.  Pt required minor cues for form of exercise; tolerated all well, noting elimination of pain by end of session.  FOTO goal achieved.  Pt pleased with  level of function; requests to hold therapy while he continues HEP.    Rehab Potential Good    PT Frequency 2x / week    PT Duration 6 weeks    PT Treatment/Interventions Dry needling;Taping;Manual techniques;Patient/family education;Neuromuscular re-education;Balance training;Therapeutic exercise;Therapeutic activities;Electrical Stimulation;Aquatic Therapy;Iontophoresis 38m/ml Dexamethasone;Moist Heat;Traction;Cryotherapy    PT Next Visit Plan Hold therapy until 10/14; d/c if pt doesn't return.    PT Home Exercise Plan [email or web address]    Consulted and Agree with Plan of Care Patient             Patient will benefit from skilled therapeutic intervention in order to improve the following deficits and impairments:  Pain, Decreased strength, Decreased activity tolerance, Decreased range of motion, Increased muscle spasms, Hypomobility  Visit Diagnosis: Muscle weakness (generalized)  Acute left-sided low back pain without sciatica  Other symptoms and signs involving the musculoskeletal system     Problem List Patient Active Problem List   Diagnosis Date Noted   DDD (degenerative disc disease), lumbar 03/16/2021   Left cervical radiculopathy 08/17/2016   Left shoulder pain 08/17/2016   White coat hypertension 05/13/2015   Hypertension 11/15/2011   Erectile dysfunction 11/15/2011   HYPERCHOLESTEROLEMIA 05/15/2009   PHYSICAL THERAPY DISCHARGE SUMMARY  Visits from Start of Care: 4  Current functional level related to goals / functional outcomes: Improved strength and activity tolerance, decreased pain   Remaining deficits: Pain with prolonged sitting   Education / Equipment: HEP   Patient agrees to discharge. Patient goals were met. Patient is being discharged due to meeting the stated rehab goals. KIsabelle Course PT,DPT10/21/228:55 AM  JKerin Perna PTA 04/06/21 12:58 PM  [id number] [address] [address]Phone: [phone]  Fax:  [phone] Name: Adrian JasinskiMRN: [id number] of Birth: 8January 24, 1972

## 2021-04-06 NOTE — Patient Instructions (Signed)
Access Code: [email or web address] URL: [email or web address] Date: 04/06/2021 Prepared by: [place]  Exercises Standing Quadriceps Stretch - 1 x daily - 7 x weekly - 1 sets - 2-3 reps - 20-30 hold Hooklying Hamstring Stretch with Strap - 1 x daily - 7 x weekly - 1 sets - 2-3 reps - 20-30 hold Supine Piriformis Stretch with Foot on Ground - 1 x daily - 7 x weekly - 1 sets - 2-3 reps - 20-30 sec hold Prone on Elbows Stretch - 1 x daily - 7 x weekly - 1 sets - 5 reps Cat Cow - 1 x daily - 7 x weekly - 1 sets - 5 reps Sidelying Thoracic Rotation with Open Book - 1 x daily - 7 x weekly - 1 sets - 5 reps Supine Transversus Abdominis Bracing with Pelvic Floor Contraction - 2 x daily - 7 x weekly - 1 sets - 10 reps - 10sec hold Sit to Stand - 2 x daily - 7 x weekly - 1 sets - 10 reps - 3-5 sec hold Wall Quarter Squat - 2 x daily - 7 x weekly - 1-2 sets - 10 reps - 5-10 sec hold Standing Bilateral Low Shoulder Row with Anchored Resistance - 2 x daily - 7 x weekly - 1-3 sets - 10 reps - 2-3 sec hold Drawing Bow - 1 x daily - 7 x weekly - 1 sets - 10 reps - 3 sec hold Anti-Rotation Lateral Stepping with Press - 2 x daily - 7 x weekly - 1-2 sets - 10 reps - 2-3 sec hold Kettlebell Deadlift Modified Thomas Stretch - 1 x daily - 7 x weekly - 1 sets - 2-3 reps - 20 seconds hold

## 2021-04-09 ENCOUNTER — Encounter: Payer: Managed Care, Other (non HMO) | Admitting: Rehabilitative and Restorative Service Providers"

## 2021-04-14 ENCOUNTER — Encounter: Payer: Self-pay | Admitting: Gastroenterology

## 2021-04-27 ENCOUNTER — Ambulatory Visit: Payer: Managed Care, Other (non HMO) | Admitting: Sports Medicine

## 2021-05-11 ENCOUNTER — Other Ambulatory Visit: Payer: Self-pay

## 2021-05-11 ENCOUNTER — Encounter: Payer: Self-pay | Admitting: Gastroenterology

## 2021-05-11 ENCOUNTER — Ambulatory Visit (AMBULATORY_SURGERY_CENTER): Payer: Managed Care, Other (non HMO)

## 2021-05-11 VITALS — Ht 71.25 in | Wt 220.0 lb

## 2021-05-11 DIAGNOSIS — Z1211 Encounter for screening for malignant neoplasm of colon: Secondary | ICD-10-CM

## 2021-05-11 MED ORDER — PLENVU 140 G PO SOLR: 1.0000 | ORAL | 0 refills | Status: DC

## 2021-05-11 NOTE — Progress Notes (Signed)
   Patient's pre-visit was done today over the phone with the patient   Name,DOB and address verified.   Patient denies any allergies to Eggs and Soy.   Never has been intubated  Patient denies taking diet pills or blood thinners.  Denies atrial flutter or atrial fib Denies chronic constipation No home Oxygen.   Packet of Prep instructions mailed to patient including a copy of a consent form-pt is aware.  Patient understands to call us back with any questions or concerns.  Patient is aware of our care-partner policy and [id number] safety protocol.   EMMI education assigned to the patient for the procedure, sent to [facility].   The patient is COVID-19 vaccinated.

## 2021-05-26 ENCOUNTER — Ambulatory Visit (AMBULATORY_SURGERY_CENTER): Payer: Managed Care, Other (non HMO) | Admitting: Gastroenterology

## 2021-05-26 ENCOUNTER — Other Ambulatory Visit: Payer: Self-pay

## 2021-05-26 ENCOUNTER — Encounter: Payer: Self-pay | Admitting: Gastroenterology

## 2021-05-26 VITALS — BP 140/90 | HR 63 | Temp 97.3°F | Resp 14 | Ht 71.0 in | Wt 220.0 lb

## 2021-05-26 DIAGNOSIS — R195 Other fecal abnormalities: Secondary | ICD-10-CM

## 2021-05-26 DIAGNOSIS — D123 Benign neoplasm of transverse colon: Secondary | ICD-10-CM

## 2021-05-26 DIAGNOSIS — D122 Benign neoplasm of ascending colon: Secondary | ICD-10-CM

## 2021-05-26 MED ORDER — SODIUM CHLORIDE 0.9 % IV SOLN: 500.0000 mL | Freq: Once | INTRAVENOUS | Status: DC

## 2021-05-26 NOTE — Progress Notes (Signed)
History and Physical:  This patient presents for endoscopic testing for: Encounter Diagnosis  Name Primary?   Positive colorectal cancer screening using Cologuard test Yes    Patient denies chronic abdominal pain, rectal bleeding, constipation or diarrhea.   ROS: Patient denies chest pain or cough   Past Medical History: Past Medical History:  Diagnosis Date   Erectile dysfunction    GERD (gastroesophageal reflux disease)    Hyperlipidemia    Hypertension      Past Surgical History: Past Surgical History:  Procedure Laterality Date   UPPER GASTROINTESTINAL ENDOSCOPY     VASECTOMY      Allergies: No Active Allergies  Outpatient Meds: Current Outpatient Medications  Medication Sig Dispense Refill   atorvastatin (LIPITOR) 10 MG tablet TAKE 1 TABLET BY MOUTH EVERY DAY 90 tablet 3   Lansoprazole (PREVACID PO) Take by mouth.     lisinopril-hydrochlorothiazide (ZESTORETIC) 20-25 MG tablet Take 1 tablet by mouth daily. 90 tablet 3   Multiple Vitamins-Minerals (MULTIVITAL PO) Take by mouth.     Probiotic Product (PROBIOTIC PO) Take by mouth.     PSYLLIUM PO Take by mouth.     cyclobenzaprine (FLEXERIL) 10 MG tablet One half to one tab PO qHS, then increase gradually to one tab TID. (Patient not taking: No sig reported) 30 tablet 0   Current Facility-Administered Medications  Medication Dose Route Frequency Provider Last Rate Last Admin   0.9 %  sodium chloride infusion  500 mL Intravenous Once Nelida Meuse III, MD          ___________________________________________________________________ Objective   Exam:  BP (!) 140/96   Pulse 79   Temp (!) 97.3 F (36.3 C) (Temporal)   Resp 10   Ht 5\' 11"  (1.803 m)   Wt 220 lb (99.8 kg)   SpO2 100%   BMI 30.68 kg/m   CV: RRR without murmur, S1/S2 Resp: clear to auscultation bilaterally, normal RR and effort noted GI: soft, no tenderness, with active bowel sounds.   Assessment: Encounter Diagnosis  Name Primary?    Positive colorectal cancer screening using Cologuard test Yes     Plan: Colonoscopy  The benefits and risks of the planned procedure were described in detail with the patient or (when appropriate) their health care proxy.  Risks were outlined as including, but not limited to, bleeding, infection, perforation, adverse medication reaction leading to cardiac or pulmonary decompensation, pancreatitis (if ERCP).  The limitation of incomplete mucosal visualization was also discussed.  No guarantees or warranties were given.    The patient is appropriate for an endoscopic procedure in the ambulatory setting.   - Wilfrid Lund, MD

## 2021-05-26 NOTE — Patient Instructions (Signed)
Handout on polyps given. ° °YOU HAD AN ENDOSCOPIC PROCEDURE TODAY AT THE [facility]:   Refer to the procedure report that was given to you for any specific questions about what was found during the examination.  If the procedure report does not answer your questions, please call your gastroenterologist to clarify.  If you requested that your care partner not be given the details of your procedure findings, then the procedure report has been included in a sealed envelope for you to review at your convenience later. ° °YOU SHOULD EXPECT: Some feelings of bloating in the abdomen. Passage of more gas than usual.  Walking can help get rid of the air that was put into your GI tract during the procedure and reduce the bloating. If you had a lower endoscopy (such as a colonoscopy or flexible sigmoidoscopy) you may notice spotting of blood in your stool or on the toilet paper. If you underwent a bowel prep for your procedure, you may not have a normal bowel movement for a few days. ° °Please Note:  You might notice some irritation and congestion in your nose or some drainage.  This is from the oxygen used during your procedure.  There is no need for concern and it should clear up in a day or so. ° °SYMPTOMS TO REPORT IMMEDIATELY: ° °Following lower endoscopy (colonoscopy or flexible sigmoidoscopy): ° Excessive amounts of blood in the stool ° Significant tenderness or worsening of abdominal pains ° Swelling of the abdomen that is new, acute ° Fever of 100°F or higher ° °For urgent or emergent issues, a gastroenterologist can be reached at any hour by calling [phone]. °Do not use MyChart messaging for urgent concerns.  ° ° °DIET:  We do recommend a small meal at first, but then you may proceed to your regular diet.  Drink plenty of fluids but you should avoid alcoholic beverages for 24 hours. ° °ACTIVITY:  You should plan to take it easy for the rest of today and you should NOT DRIVE or use heavy machinery  until tomorrow (because of the sedation medicines used during the test).   ° °FOLLOW UP: °Our staff will call the number listed on your records 48-72 hours following your procedure to check on you and address any questions or concerns that you may have regarding the information given to you following your procedure. If we do not reach you, we will leave a message.  We will attempt to reach you two times.  During this call, we will ask if you have developed any symptoms of COVID 19. If you develop any symptoms (ie: fever, flu-like symptoms, shortness of breath, cough etc.) before then, please call [phone].  If you test positive for Covid 19 in the 2 weeks post procedure, please call and report this information to us.   ° °If any biopsies were taken you will be contacted by phone or by letter within the next 1-3 weeks.  Please call us at [phone] if you have not heard about the biopsies in 3 weeks.  ° ° °SIGNATURES/CONFIDENTIALITY: °You and/or your care partner have signed paperwork which will be entered into your electronic medical record.  These signatures attest to the fact that that the information above on your After Visit Summary has been reviewed and is understood.  Full responsibility of the confidentiality of this discharge information lies with you and/or your care-partner.  °

## 2021-05-26 NOTE — Progress Notes (Signed)
PT taken to PACU. Monitors in place. VSS. Report given to RN. 

## 2021-05-26 NOTE — Progress Notes (Signed)
Called to room to assist during endoscopic procedure.  Patient ID and intended procedure confirmed with present staff. Received instructions for my participation in the procedure from the performing physician.  

## 2021-05-26 NOTE — Op Note (Signed)
[facility] Patient Name: Adrian Mccarty Procedure Date: 05/26/2021 8:58 AM MRN: [id number] Endoscopist: Mallie Mussel L. Loletha Carrow , MD Age: 50 Referring MD:  Date of Birth: 1970/12/03 Gender: Male Account #: [id number] Procedure:                Colonoscopy Indications:              Positive Cologuard test Medicines:                Monitored Anesthesia Care Procedure:                Pre-Anesthesia Assessment:                           - Prior to the procedure, a History and Physical                            was performed, and patient medications and                            allergies were reviewed. The patient's tolerance of                            previous anesthesia was also reviewed. The risks                            and benefits of the procedure and the sedation                            options and risks were discussed with the patient.                            All questions were answered, and informed consent                            was obtained. Prior Anticoagulants: The patient has                            taken no previous anticoagulant or antiplatelet                            agents. ASA Grade Assessment: II - A patient with                            mild systemic disease. After reviewing the risks                            and benefits, the patient was deemed in                            satisfactory condition to undergo the procedure.                           After obtaining informed consent, the colonoscope  was passed under direct vision. Throughout the                            procedure, the patient's blood pressure, pulse, and                            oxygen saturations were monitored continuously. The                            CF [id number] #[id number] was introduced through the anus                            and advanced to the the cecum, identified by                            appendiceal orifice and ileocecal valve. The                             colonoscopy was performed without difficulty. The                            patient tolerated the procedure well. The quality                            of the bowel preparation was excellent. The                            ileocecal valve, appendiceal orifice, and rectum                            were photographed. Scope In: 9:24:31 AM Scope Out: 9:40:15 AM Scope Withdrawal Time: 0 hours 13 minutes 20 seconds  Total Procedure Duration: 0 hours 15 minutes 44 seconds  Findings:                 The perianal and digital rectal examinations were                            normal.                           Four sessile polyps were found in the transverse                            colon and ascending colon. The polyps were 3 to 6                            mm in size. These polyps were removed with a cold                            snare. Resection and retrieval were complete.                           The exam was otherwise without abnormality on  direct and retroflexion views. Complications:            No immediate complications. Estimated Blood Loss:     Estimated blood loss was minimal. Impression:               - Four 3 to 6 mm polyps in the transverse colon and                            in the ascending colon, removed with a cold snare.                            Resected and retrieved.                           - The examination was otherwise normal on direct                            and retroflexion views. Recommendation:           - Patient has a contact number available for                            emergencies. The signs and symptoms of potential                            delayed complications were discussed with the                            patient. Return to normal activities tomorrow.                            Written discharge instructions were provided to the                            patient.                            - Resume previous diet.                           - Continue present medications.                           - Await pathology results.                           - Repeat colonoscopy is recommended for                            surveillance. The colonoscopy date will be                            determined after pathology results from today's                            exam become available for review. Monee Dembeck L. Loletha Carrow, MD 05/26/2021 9:47:27 AM This report has  been signed electronically.

## 2021-05-26 NOTE — Progress Notes (Signed)
Pt's states no medical or surgical changes since previsit or office visit.  CW - vitals 

## 2021-05-28 ENCOUNTER — Telehealth: Payer: Self-pay

## 2021-05-28 NOTE — Telephone Encounter (Signed)
  Follow up Call-  Call back number 05/26/2021  Post procedure Call Back phone  # [phone]  Permission to leave phone message Yes  Some recent data might be hidden     Patient questions:  Do you have a fever, pain , or abdominal swelling? No. Pain Score  0 *  Have you tolerated food without any problems? Yes.    Have you been able to return to your normal activities? Yes.    Do you have any questions about your discharge instructions: Diet   No. Medications  No. Follow up visit  No.  Do you have questions or concerns about your Care? No.  Actions: * If pain score is 4 or above: No action needed, pain <4. Have you developed a fever since your procedure? No    2.   Have you had an respiratory symptoms (SOB or cough) since your procedure? no  3.   Have you tested positive for COVID 19 since your procedure no  4.   Have you had any family members/close contacts diagnosed with the COVID 19 since your procedure?  no   If yes to any of these questions please route to Joylene John, RN and Joella Prince, RN

## 2021-06-03 ENCOUNTER — Encounter: Payer: Self-pay | Admitting: Gastroenterology

## 2021-06-08 ENCOUNTER — Emergency Department: Admit: 2021-06-08 | Discharge status: Absent without leave | Payer: Self-pay

## 2021-07-16 ENCOUNTER — Encounter: Payer: Self-pay | Admitting: Sports Medicine

## 2021-09-03 ENCOUNTER — Ambulatory Visit: Payer: Managed Care, Other (non HMO) | Admitting: Medical-Surgical

## 2021-09-03 ENCOUNTER — Other Ambulatory Visit: Payer: Self-pay

## 2021-09-03 ENCOUNTER — Encounter: Payer: Self-pay | Admitting: Medical-Surgical

## 2021-09-03 VITALS — BP 155/93 | HR 81 | Resp 20 | Ht 71.0 in | Wt 227.1 lb

## 2021-09-03 DIAGNOSIS — J329 Chronic sinusitis, unspecified: Secondary | ICD-10-CM | POA: Insufficient documentation

## 2021-09-03 DIAGNOSIS — M79622 Pain in left upper arm: Secondary | ICD-10-CM | POA: Diagnosis not present

## 2021-09-03 DIAGNOSIS — N451 Epididymitis: Secondary | ICD-10-CM | POA: Insufficient documentation

## 2021-09-03 DIAGNOSIS — E785 Hyperlipidemia, unspecified: Secondary | ICD-10-CM | POA: Insufficient documentation

## 2021-09-03 DIAGNOSIS — J309 Allergic rhinitis, unspecified: Secondary | ICD-10-CM | POA: Insufficient documentation

## 2021-09-03 NOTE — Progress Notes (Signed)
°  HPI with pertinent ROS:   CC: left upper arm pain  HPI: Pleasant 51 year old male presenting today for left upper arm pain that has been intermittent over the last week. Notes that the pain is sharp and shooting but only in certain situations. The pain is located on the medial aspect of the bicep when his arm is flexed although movement or position of the arm don't seem to provoke it. Notes it most often if he tucks an object under his arm to carry it when his hands are full. Pain does not radiate but there is some tenderness that is at the medial side of the elbow. No numbness, tingling, weakness, swelling, erythema, trauma, or injury to the arm. Was able to go play to rounds of golf without pain the other day. Does go to the gym to exercise but has been doing mostly cardio recently. Worried about a possible blood clot because he will be flying out on Monday for a planned trip.   I reviewed the past medical history, family history, social history, surgical history, and allergies today and no changes were needed.  Please see the problem list section below in epic for further details.   Physical exam:   General: Well Developed, well nourished, and in no acute distress.  Neuro: Alert and oriented x3.  HEENT: Normocephalic, atraumatic.  Skin: Warm and dry. Cardiac: Regular rate and rhythm, no murmurs rubs or gallops, no lower extremity edema.  Respiratory: Clear to auscultation bilaterally. Not using accessory muscles, speaking in full sentences. Left arm: ROM intact to all joints. Muscle strength 5/5. Neurovascularly intact. Point tenderness and pain reproduction with deep palpation of the medial biceps. Tenderness extending over the medial malleolus.   Impression and Recommendations:    1. Left upper arm pain Unclear etiology. No known injury. Exam grossly normal. Low risk for a blood clot and low suspicion given the history and exam findings. Suspect nerve impingement vs tendinitis. Reviewed  conservative treatment. He has Mobic at home so will plan to take that for ease of use rather than OTC Ibuprofen. Reviewed other conservative measures for management including heat, ice, stretching, and compression. Patient verbalized understanding and is agreeable to the plan.   Return if symptoms worsen or fail to improve. ___________________________________________ Clearnce Sorrel, DNP, APRN, FNP-BC [facility] [place]

## 2021-10-21 ENCOUNTER — Ambulatory Visit: Payer: Managed Care, Other (non HMO) | Admitting: Podiatry

## 2021-10-21 ENCOUNTER — Ambulatory Visit (INDEPENDENT_AMBULATORY_CARE_PROVIDER_SITE_OTHER): Payer: Managed Care, Other (non HMO)

## 2021-10-21 ENCOUNTER — Encounter: Payer: Self-pay | Admitting: Podiatry

## 2021-10-21 DIAGNOSIS — M79671 Pain in right foot: Secondary | ICD-10-CM

## 2021-10-21 DIAGNOSIS — M21611 Bunion of right foot: Secondary | ICD-10-CM | POA: Diagnosis not present

## 2021-10-21 DIAGNOSIS — M109 Gout, unspecified: Secondary | ICD-10-CM

## 2021-10-21 DIAGNOSIS — M205X1 Other deformities of toe(s) (acquired), right foot: Secondary | ICD-10-CM | POA: Diagnosis not present

## 2021-10-21 MED ORDER — DEXAMETHASONE SODIUM PHOSPHATE 120 MG/30ML IJ SOLN: 4.0000 mg | Freq: Once | INTRAMUSCULAR | Status: DC

## 2021-10-21 MED ORDER — MELOXICAM 15 MG PO TABS: 15.0000 mg | ORAL_TABLET | Freq: Every day | ORAL | 0 refills | Status: AC

## 2021-10-21 NOTE — Progress Notes (Signed)
?  Subjective:  ?Patient ID: Adrian Mccarty, male    DOB: 1971-07-17,   MRN: [id number] ? ?Chief Complaint  ?Patient presents with  ? Bunions  ?  Rt bunion  ? ? ?51 y.o. male presents for concern of right foot bunion. Relates this has been bothering him for several months. He does have a history of gout and a family history. Relates he has been having more flares recently but has also noticed more pain in the toe and the toe moving over. Uses aleve during flares and cherry juice.   . Denies any other pedal complaints. Denies n/v/f/c.  ? ?Past Medical History:  ?Diagnosis Date  ? Erectile dysfunction   ? GERD (gastroesophageal reflux disease)   ? Hyperlipidemia   ? Hypertension   ? ? ?Objective:  ?Physical Exam: ?Vascular: DP/PT pulses 2/4 bilateral. CFT <3 seconds. Normal hair growth on digits. No edema.  ?Skin. No lacerations or abrasions bilateral feet.  ?Musculoskeletal: MMT 5/5 bilateral lower extremities in DF, PF, Inversion and Eversion. Deceased ROM in DF of ankle joint. Mild HAV deformity noted. Pain with ROM of the great toe joint. Pain to the medial eminence and plantar aspect of the joint.  ?Neurological: Sensation intact to light touch.  ? ?Assessment:  ? ?1. Hallux limitus of right foot   ?2. Gouty arthritis of foot   ?3. Bunion, right foot   ? ? ? ?Plan:  ?Patient was evaluated and treated and all questions answered. ?-Xrays reviewed ?-Discussed hallux limitus, gouty arthirits and  treatement options; conservative and  Surgical management; risks, benefits, alternatives discussed. All patient's questions answered. ?-Discussed gout and flares. Recommend following up with PCP about long term gout maintenance as well.  ?-Rx Meloxicam provided. ?-Patient opted for injection of first MTPJ. After verbal consent, injected into right/left 1st MTPJ for symptomatic relief 1cc marcaine plain, 1cc dexamethasone without complication.   ?-Recommend continue with good supportive shoes and inserts. Discussed stiff soled  shoes and use of carbon fiber foot plate.   ?-Patient to return to office as needed or sooner if condition worsens. ? ? ?Lorenda Peck, DPM  ? ? ?

## 2022-01-10 ENCOUNTER — Other Ambulatory Visit: Payer: Self-pay | Admitting: Osteopathic Medicine

## 2022-01-10 DIAGNOSIS — I10 Essential (primary) hypertension: Secondary | ICD-10-CM

## 2022-01-25 ENCOUNTER — Encounter: Payer: Self-pay | Admitting: Podiatry

## 2022-01-25 ENCOUNTER — Ambulatory Visit: Payer: Managed Care, Other (non HMO) | Admitting: Podiatry

## 2022-01-25 ENCOUNTER — Ambulatory Visit (INDEPENDENT_AMBULATORY_CARE_PROVIDER_SITE_OTHER): Payer: Managed Care, Other (non HMO)

## 2022-01-25 DIAGNOSIS — M2012 Hallux valgus (acquired), left foot: Secondary | ICD-10-CM | POA: Diagnosis not present

## 2022-01-25 DIAGNOSIS — M7752 Other enthesopathy of left foot: Secondary | ICD-10-CM

## 2022-01-25 DIAGNOSIS — M205X2 Other deformities of toe(s) (acquired), left foot: Secondary | ICD-10-CM | POA: Diagnosis not present

## 2022-01-25 MED ORDER — OXYCODONE-ACETAMINOPHEN 5-325 MG PO TABS: 1.0000 | ORAL_TABLET | ORAL | 0 refills | Status: DC | PRN

## 2022-01-25 NOTE — Progress Notes (Signed)
  Subjective:  Patient ID: Adrian Mccarty, male    DOB: 1971-05-28,   MRN: [id number]  Chief Complaint  Patient presents with   Foot Pain    "It's this joint on my left foot."    51 y.o. male presents for concern of left MPJ gout flare.  Patient has received injection in the past by Dr. Blenda Mounts on the right side which seems to be doing really good.  Now is the left side.  He would like to know if he can do another injection he is going on a trip and needs the pain relief.  He denies any other acute complaints has been managing gout with diet controlled.  Past Medical History:  Diagnosis Date   Erectile dysfunction    GERD (gastroesophageal reflux disease)    Hyperlipidemia    Hypertension     Objective:  Physical Exam: Vascular: DP/PT pulses 2/4 bilateral. CFT <3 seconds. Normal hair growth on digits. No edema.  Skin. No lacerations or abrasions bilateral feet.  Musculoskeletal: MMT 5/5 bilateral lower extremities in DF, PF, Inversion and Eversion. Deceased ROM in DF of ankle joint. Mild HAV deformity noted. Pain with ROM of the great toe joint. Pain to the medial eminence and plantar aspect of the joint.  Neurological: Sensation intact to light touch.    3 views of skeletally mature adult left foot:Subchondral cystic changes noted to the medial aspect of the first MPJ likely due to gouty changes.  Hallux limitus noted.  No other bony abnormalities identified.  Assessment:   1. Capsulitis of metatarsophalangeal (MTP) joint of left foot   2. Hallux limitus, left      Plan:  Patient was evaluated and treated and all questions answered. -Xrays reviewed -Discussed hallux limitus, gouty arthirits and  treatement options; conservative and  Surgical management; risks, benefits, alternatives discussed. All patient's questions answered. -Discussed gout and flares. Recommend following up with PCP about long term gout maintenance as well.  -Rx Percocet was provided -Patient opted for  injection of first MTPJ. After verbal consent, injected into left 1st MTPJ for symptomatic relief 1cc marcaine plain, 1cc dexamethasone without complication.   -Recommend continue with good supportive shoes and inserts. Discussed stiff soled shoes and use of carbon fiber foot plate.   -Patient to return to office as needed or sooner if condition worsens.

## 2022-02-12 ENCOUNTER — Other Ambulatory Visit: Payer: Self-pay | Admitting: Medical-Surgical

## 2022-02-12 DIAGNOSIS — I10 Essential (primary) hypertension: Secondary | ICD-10-CM

## 2022-03-05 ENCOUNTER — Other Ambulatory Visit: Payer: Self-pay | Admitting: Osteopathic Medicine

## 2022-03-05 DIAGNOSIS — E785 Hyperlipidemia, unspecified: Secondary | ICD-10-CM

## 2024-01-20 ENCOUNTER — Ambulatory Visit
Admission: RE | Admit: 2024-01-20 | Discharge: 2024-01-20 | Disposition: A | Discharge status: Home / Self Care | Source: Ambulatory Visit | Attending: Family Medicine | Admitting: Family Medicine

## 2024-01-20 VITALS — BP 136/90 | HR 88 | Temp 99.3°F | Resp 18 | Ht 71.0 in | Wt 215.0 lb

## 2024-01-20 DIAGNOSIS — T700XXA Otitic barotrauma, initial encounter: Secondary | ICD-10-CM | POA: Diagnosis not present

## 2024-01-20 DIAGNOSIS — J209 Acute bronchitis, unspecified: Secondary | ICD-10-CM

## 2024-01-20 MED ORDER — HYDROCOD POLI-CHLORPHE POLI ER 10-8 MG/5ML PO SUER: 5.0000 mL | Freq: Two times a day (BID) | ORAL | 0 refills | Status: AC | PRN

## 2024-01-20 MED ORDER — PREDNISONE 50 MG PO TABS: ORAL_TABLET | ORAL | 0 refills | Status: AC

## 2024-01-20 MED ORDER — AMOXICILLIN-POT CLAVULANATE 875-125 MG PO TABS: 1.0000 | ORAL_TABLET | Freq: Two times a day (BID) | ORAL | 0 refills | Status: AC

## 2024-01-20 NOTE — ED Provider Notes (Signed)
 Adrian Mccarty    CSN: [id number] Arrival date & time: 01/20/24  9160      History   Chief Complaint Chief Complaint  Patient presents with   Cough    Have had a terrible cough for over a week, crazy amounts of phlegm production, ears hurt horribly and constantly ringing. - Entered by patient    HPI Adrian Mccarty is a 53 y.o. male.   HPI   Patient states that he has had a cough for over a week.  Productive.  Chest hurts from the coughing.  He is feeling very tired.  He did a COVID test that was negative.  He recently flew for business.  When he came back he had run left ear greater than his right.  Has some sinus congestion and sore throat as well.  Past Medical History:  Diagnosis Date   Erectile dysfunction    GERD (gastroesophageal reflux disease)    Hyperlipidemia    Hypertension     Patient Active Problem List   Diagnosis Date Noted   Sinusitis 09/03/2021   Hyperlipidemia 09/03/2021   Epididymitis 09/03/2021   Allergic rhinitis 09/03/2021   DDD (degenerative disc disease), lumbar 03/16/2021   Lower back pain 03/07/2021   Left cervical radiculopathy 08/17/2016   Left shoulder pain 08/17/2016   White coat hypertension 05/13/2015   Hypertension 11/15/2011   ED (erectile dysfunction) 11/15/2011   HYPERCHOLESTEROLEMIA 05/15/2009    Past Surgical History:  Procedure Laterality Date   UPPER GASTROINTESTINAL ENDOSCOPY     VASECTOMY         Home Medications    Prior to Admission medications   Medication Sig Start Date End Date Taking? Authorizing Provider  amoxicillin -clavulanate (AUGMENTIN ) 875-125 MG tablet Take 1 tablet by mouth every 12 (twelve) hours. 01/20/24  Yes Maranda Jamee Jacob, MD  atorvastatin  (LIPITOR) 10 MG tablet TAKE 1 TABLET BY MOUTH EVERY DAY. NO REFILLS. NEEDS TO TRANSFER Mccarty TO NEW PCP 03/08/22  Yes Willo Mini, NP  chlorpheniramine-HYDROcodone  (TUSSIONEX) 10-8 MG/5ML Take 5 mLs by mouth every 12 (twelve) hours as needed for cough.  01/20/24  Yes Maranda Jamee Jacob, MD  Lansoprazole (PREVACID PO) Take by mouth.   Yes [provider]  lisinopril -hydrochlorothiazide  (ZESTORETIC ) 20-25 MG tablet Take 1 tablet by mouth daily. NO REFILLS. NEEDS TO TRANSFER Mccarty TO NEW PCP 01/19/22  Yes Willo Mini, NP  Multiple Vitamins-Minerals (MULTIVITAL PO) Take by mouth.   Yes [provider]  predniSONE  (DELTASONE ) 50 MG tablet Take once a day for 5 days.  Take with food 01/20/24  Yes Maranda Jamee Jacob, MD  Probiotic Product (PROBIOTIC PO) Take by mouth.   Yes [provider]  meloxicam  (MOBIC ) 15 MG tablet Take 1 tablet (15 mg total) by mouth daily. 10/21/21   Joya Stabs, DPM    Family History Family History  Problem Relation Age of Onset   Heart disease Mother    Heart attack Mother        age: late 80's   Hypertension Father    Heart disease Maternal Grandfather    Heart disease Paternal Grandfather    Colon cancer Neg Hx    Colon polyps Neg Hx    Esophageal cancer Neg Hx    Stomach cancer Neg Hx    Rectal cancer Neg Hx     Social History Social History   Tobacco Use   Smoking status: Former   Smokeless tobacco: Former    Types: Engineer, drilling  Vaping status: Never Used  Substance Use Topics   Alcohol use: Yes    Alcohol/week: 6.0 standard drinks of alcohol    Types: 6 Cans of beer per week   Drug use: No     Allergies   Patient has no known allergies.   Review of Systems Review of Systems See HPI  Physical Exam Triage Vital Signs ED Triage Vitals [01/20/24 0851]  Encounter Vitals Group     BP (!) 136/90     Girls Systolic BP Percentile      Girls Diastolic BP Percentile      Boys Systolic BP Percentile      Boys Diastolic BP Percentile      Pulse Rate 88     Resp 18     Temp 99.3 F (37.4 C)     Temp Source Oral     SpO2 98 %     Weight 215 lb (97.5 kg)     Height 5' 11 (1.803 m)     Head Circumference      Peak Flow      Pain Score 0     Pain Loc      Pain  Education      Exclude from Growth Chart    No data found.  Updated Vital Signs BP (!) 136/90 (BP Location: Right Arm)   Pulse 88   Temp 99.3 F (37.4 C) (Oral)   Resp 18   Ht 5' 11 (1.803 m)   Wt 97.5 kg   SpO2 98%   BMI 29.99 kg/m      Physical Exam HENT:     Right Ear: Tympanic membrane and ear canal normal.     Ears:     Comments: Left TM has bleeding and barotrauma with a small perforation.  Dull in appearance    Nose: No congestion.     Mouth/Throat:     Mouth: Mucous membranes are moist.     Pharynx: Posterior oropharyngeal erythema present.  Eyes:     Extraocular Movements: Extraocular movements intact.     Pupils: Pupils are equal, round, and reactive to light.  Cardiovascular:     Rate and Rhythm: Regular rhythm.     Heart sounds: Normal heart sounds.  Pulmonary:     Effort: Pulmonary effort is normal. No respiratory distress.     Breath sounds: Rhonchi present.  Lymphadenopathy:     Cervical: Cervical adenopathy present.      UC Treatments / Results  Labs (all labs ordered are listed, but only abnormal results are displayed) Labs Reviewed - No data to display  EKG   Radiology No results found.  Procedures Procedures (including critical Mccarty time)  Medications Ordered in UC Medications - No data to display  Initial Impression / Assessment and Plan / UC Course  I have reviewed the triage vital signs and the nursing notes.  Pertinent labs & imaging results that were available during my Mccarty of the patient were reviewed by me and considered in my medical decision making (see chart for details).     Final Clinical Impressions(s) / UC Diagnoses   Final diagnoses:  Otitic barotrauma of left ear  Acute bronchitis, unspecified organism     Discharge Instructions      Make sure you are drinking lots of fluids Take the antibiotic 2 times a day.  Take 2 doses today Take prednisone  once a day until gone I have prescribed Tussionex for the  cough.  This is useful  at bedtime.  It may cause drowsiness during the day Have your primary Mccarty doctor check your left ear in a couple weeks   ED Prescriptions     Medication Sig Dispense Auth. Provider   predniSONE  (DELTASONE ) 50 MG tablet Take once a day for 5 days.  Take with food 5 tablet Maranda Jamee Jacob, MD   amoxicillin -clavulanate (AUGMENTIN ) 875-125 MG tablet Take 1 tablet by mouth every 12 (twelve) hours. 14 tablet Maranda Jamee Jacob, MD   chlorpheniramine-HYDROcodone  (TUSSIONEX) 10-8 MG/5ML Take 5 mLs by mouth every 12 (twelve) hours as needed for cough. 115 mL Maranda Jamee Jacob, MD      I have reviewed the PDMP during this encounter.   Maranda Jamee Jacob, MD 01/20/24 1018

## 2024-01-20 NOTE — Discharge Instructions (Signed)
 Make sure you are drinking lots of fluids Take the antibiotic 2 times a day.  Take 2 doses today Take prednisone  once a day until gone I have prescribed Tussionex for the cough.  This is useful at bedtime.  It may cause drowsiness during the day Have your primary care doctor check your left ear in a couple weeks

## 2024-01-20 NOTE — ED Triage Notes (Addendum)
 Patient c/o cough, congestion, ears ringing, sore throat x 3 days.  Afebrile.  Patient did recently fly for work.  Patient is taken Nyquil and Dayquil.  Home COVID test x 2 were negative.

## 2024-02-03 ENCOUNTER — Ambulatory Visit: Payer: Self-pay

## 2024-03-19 ENCOUNTER — Encounter: Payer: Self-pay | Admitting: Sports Medicine
